# Patient Record
Sex: Male | Born: 1940 | Race: White | Hispanic: No | Marital: Married | Smoking: Current some day smoker
Health system: Southern US, Community
[De-identification: ages and names within clinical notes are randomized; demographics above are authoritative.]

## PROBLEM LIST (undated history)

## (undated) DIAGNOSIS — M4800 Spinal stenosis, site unspecified: Secondary | ICD-10-CM

## (undated) DIAGNOSIS — K5730 Diverticulosis of large intestine without perforation or abscess without bleeding: Secondary | ICD-10-CM

## (undated) DIAGNOSIS — I472 Ventricular tachycardia: Secondary | ICD-10-CM

## (undated) DIAGNOSIS — I82409 Acute embolism and thrombosis of unspecified deep veins of unspecified lower extremity: Secondary | ICD-10-CM

## (undated) DIAGNOSIS — F0280 Dementia in other diseases classified elsewhere without behavioral disturbance: Secondary | ICD-10-CM

## (undated) DIAGNOSIS — N400 Enlarged prostate without lower urinary tract symptoms: Secondary | ICD-10-CM

## (undated) DIAGNOSIS — G3109 Other frontotemporal dementia: Secondary | ICD-10-CM

## (undated) DIAGNOSIS — E785 Hyperlipidemia, unspecified: Secondary | ICD-10-CM

## (undated) DIAGNOSIS — G4730 Sleep apnea, unspecified: Secondary | ICD-10-CM

## (undated) DIAGNOSIS — I10 Essential (primary) hypertension: Secondary | ICD-10-CM

## (undated) HISTORY — DX: Essential (primary) hypertension: I10

## (undated) HISTORY — PX: COLONOSCOPY: SHX174

## (undated) HISTORY — DX: Other frontotemporal dementia: G31.09

## (undated) HISTORY — PX: TONSILLECTOMY: SUR1361

## (undated) HISTORY — PX: HEMORRHOID SURGERY: SHX153

## (undated) HISTORY — DX: Hyperlipidemia, unspecified: E78.5

## (undated) HISTORY — DX: Sleep apnea, unspecified: G47.30

## (undated) HISTORY — DX: Benign prostatic hyperplasia without lower urinary tract symptoms: N40.0

## (undated) HISTORY — PX: VASECTOMY: SHX75

## (undated) HISTORY — DX: Diverticulosis of large intestine without perforation or abscess without bleeding: K57.30

## (undated) HISTORY — PX: ANAL FISTULECTOMY: SHX1139

## (undated) HISTORY — PX: OTHER SURGICAL HISTORY: SHX169

## (undated) HISTORY — DX: Dementia in other diseases classified elsewhere, unspecified severity, without behavioral disturbance, psychotic disturbance, mood disturbance, and anxiety: F02.80

## (undated) HISTORY — DX: Ventricular tachycardia: I47.2

## (undated) HISTORY — DX: Spinal stenosis, site unspecified: M48.00

## (undated) HISTORY — DX: Acute embolism and thrombosis of unspecified deep veins of unspecified lower extremity: I82.409

---

## 1973-10-05 HISTORY — PX: LUMBAR LAMINECTOMY: SHX95

## 2003-03-14 ENCOUNTER — Ambulatory Visit (HOSPITAL_COMMUNITY): Admission: RE | Admit: 2003-03-14 | Discharge: 2003-03-14 | Payer: Self-pay | Admitting: Gastroenterology

## 2004-11-14 ENCOUNTER — Ambulatory Visit: Payer: Self-pay | Admitting: Internal Medicine

## 2005-04-10 ENCOUNTER — Ambulatory Visit: Payer: Self-pay | Admitting: Internal Medicine

## 2005-09-21 ENCOUNTER — Encounter: Admission: RE | Admit: 2005-09-21 | Discharge: 2005-09-21 | Payer: Self-pay | Admitting: Orthopaedic Surgery

## 2006-01-05 ENCOUNTER — Encounter: Admission: RE | Admit: 2006-01-05 | Discharge: 2006-01-05 | Payer: Self-pay | Admitting: Otolaryngology

## 2006-05-26 ENCOUNTER — Ambulatory Visit: Payer: Self-pay | Admitting: Internal Medicine

## 2007-02-26 ENCOUNTER — Encounter: Admission: RE | Admit: 2007-02-26 | Discharge: 2007-02-26 | Payer: Self-pay | Admitting: Neurosurgery

## 2007-04-26 ENCOUNTER — Inpatient Hospital Stay (HOSPITAL_COMMUNITY): Admission: AD | Admit: 2007-04-26 | Discharge: 2007-04-28 | Payer: Self-pay | Admitting: Neurosurgery

## 2007-06-30 ENCOUNTER — Ambulatory Visit: Payer: Self-pay | Admitting: Internal Medicine

## 2007-06-30 DIAGNOSIS — R351 Nocturia: Secondary | ICD-10-CM

## 2007-06-30 DIAGNOSIS — I10 Essential (primary) hypertension: Secondary | ICD-10-CM | POA: Insufficient documentation

## 2007-06-30 DIAGNOSIS — N529 Male erectile dysfunction, unspecified: Secondary | ICD-10-CM | POA: Insufficient documentation

## 2007-06-30 DIAGNOSIS — E785 Hyperlipidemia, unspecified: Secondary | ICD-10-CM | POA: Insufficient documentation

## 2007-06-30 DIAGNOSIS — N400 Enlarged prostate without lower urinary tract symptoms: Secondary | ICD-10-CM | POA: Insufficient documentation

## 2007-06-30 DIAGNOSIS — M4800 Spinal stenosis, site unspecified: Secondary | ICD-10-CM

## 2007-07-04 ENCOUNTER — Encounter (INDEPENDENT_AMBULATORY_CARE_PROVIDER_SITE_OTHER): Payer: Self-pay | Admitting: *Deleted

## 2007-07-08 ENCOUNTER — Ambulatory Visit: Payer: Self-pay | Admitting: Internal Medicine

## 2007-07-11 ENCOUNTER — Encounter (INDEPENDENT_AMBULATORY_CARE_PROVIDER_SITE_OTHER): Payer: Self-pay | Admitting: *Deleted

## 2007-12-07 ENCOUNTER — Telehealth (INDEPENDENT_AMBULATORY_CARE_PROVIDER_SITE_OTHER): Payer: Self-pay | Admitting: *Deleted

## 2008-04-25 ENCOUNTER — Encounter: Payer: Self-pay | Admitting: Internal Medicine

## 2008-05-28 ENCOUNTER — Ambulatory Visit: Payer: Self-pay | Admitting: Internal Medicine

## 2008-07-05 ENCOUNTER — Ambulatory Visit: Payer: Self-pay | Admitting: Internal Medicine

## 2008-07-05 DIAGNOSIS — Z9189 Other specified personal risk factors, not elsewhere classified: Secondary | ICD-10-CM

## 2008-07-05 DIAGNOSIS — K5730 Diverticulosis of large intestine without perforation or abscess without bleeding: Secondary | ICD-10-CM | POA: Insufficient documentation

## 2008-07-16 ENCOUNTER — Encounter (INDEPENDENT_AMBULATORY_CARE_PROVIDER_SITE_OTHER): Payer: Self-pay | Admitting: *Deleted

## 2008-07-18 ENCOUNTER — Ambulatory Visit: Payer: Self-pay | Admitting: Internal Medicine

## 2008-07-18 LAB — CONVERTED CEMR LAB
OCCULT 1: NEGATIVE
OCCULT 2: NEGATIVE

## 2008-07-19 ENCOUNTER — Encounter (INDEPENDENT_AMBULATORY_CARE_PROVIDER_SITE_OTHER): Payer: Self-pay | Admitting: *Deleted

## 2008-08-06 ENCOUNTER — Encounter: Payer: Self-pay | Admitting: Internal Medicine

## 2008-10-08 ENCOUNTER — Encounter: Payer: Self-pay | Admitting: Internal Medicine

## 2008-10-19 ENCOUNTER — Ambulatory Visit: Payer: Self-pay | Admitting: Internal Medicine

## 2008-10-19 DIAGNOSIS — R635 Abnormal weight gain: Secondary | ICD-10-CM | POA: Insufficient documentation

## 2008-10-20 LAB — CONVERTED CEMR LAB
BUN: 18 mg/dL (ref 6–23)
Creatinine, Ser: 1 mg/dL (ref 0.4–1.5)
Potassium: 4.7 meq/L (ref 3.5–5.1)

## 2008-10-22 ENCOUNTER — Encounter (INDEPENDENT_AMBULATORY_CARE_PROVIDER_SITE_OTHER): Payer: Self-pay | Admitting: *Deleted

## 2008-12-27 ENCOUNTER — Encounter: Payer: Self-pay | Admitting: Internal Medicine

## 2009-01-21 ENCOUNTER — Encounter: Payer: Self-pay | Admitting: Internal Medicine

## 2009-01-24 ENCOUNTER — Telehealth (INDEPENDENT_AMBULATORY_CARE_PROVIDER_SITE_OTHER): Payer: Self-pay | Admitting: *Deleted

## 2009-02-13 ENCOUNTER — Encounter: Payer: Self-pay | Admitting: Internal Medicine

## 2009-04-03 ENCOUNTER — Ambulatory Visit: Payer: Self-pay | Admitting: Internal Medicine

## 2009-04-03 DIAGNOSIS — R519 Headache, unspecified: Secondary | ICD-10-CM | POA: Insufficient documentation

## 2009-04-03 DIAGNOSIS — R51 Headache: Secondary | ICD-10-CM

## 2009-04-05 ENCOUNTER — Ambulatory Visit: Payer: Self-pay | Admitting: Internal Medicine

## 2009-04-08 LAB — CONVERTED CEMR LAB: Creatinine, Ser: 1 mg/dL (ref 0.4–1.5)

## 2009-04-09 ENCOUNTER — Encounter (INDEPENDENT_AMBULATORY_CARE_PROVIDER_SITE_OTHER): Payer: Self-pay | Admitting: *Deleted

## 2009-04-10 ENCOUNTER — Ambulatory Visit: Payer: Self-pay | Admitting: Internal Medicine

## 2009-04-11 ENCOUNTER — Ambulatory Visit: Payer: Self-pay | Admitting: Internal Medicine

## 2009-04-12 ENCOUNTER — Encounter (INDEPENDENT_AMBULATORY_CARE_PROVIDER_SITE_OTHER): Payer: Self-pay | Admitting: *Deleted

## 2009-04-15 LAB — CONVERTED CEMR LAB
ALT: 33 units/L (ref 0–53)
Albumin: 4.2 g/dL (ref 3.5–5.2)
Alkaline Phosphatase: 51 units/L (ref 39–117)
Bilirubin, Direct: 0 mg/dL (ref 0.0–0.3)
HDL: 30.7 mg/dL — ABNORMAL LOW (ref >39.00)
LDL Cholesterol: 84 mg/dL (ref 0–99)
Total Bilirubin: 0.9 mg/dL (ref 0.3–1.2)
Total Protein: 7 g/dL (ref 6.0–8.3)

## 2009-05-15 ENCOUNTER — Encounter: Payer: Self-pay | Admitting: Internal Medicine

## 2009-07-31 ENCOUNTER — Telehealth (INDEPENDENT_AMBULATORY_CARE_PROVIDER_SITE_OTHER): Payer: Self-pay | Admitting: *Deleted

## 2009-09-09 ENCOUNTER — Encounter: Payer: Self-pay | Admitting: Internal Medicine

## 2009-10-07 ENCOUNTER — Telehealth (INDEPENDENT_AMBULATORY_CARE_PROVIDER_SITE_OTHER): Payer: Self-pay | Admitting: *Deleted

## 2009-10-07 ENCOUNTER — Ambulatory Visit: Payer: Self-pay | Admitting: Internal Medicine

## 2009-10-07 DIAGNOSIS — R413 Other amnesia: Secondary | ICD-10-CM | POA: Insufficient documentation

## 2009-10-07 DIAGNOSIS — G4730 Sleep apnea, unspecified: Secondary | ICD-10-CM | POA: Insufficient documentation

## 2009-10-14 ENCOUNTER — Encounter (INDEPENDENT_AMBULATORY_CARE_PROVIDER_SITE_OTHER): Payer: Self-pay | Admitting: *Deleted

## 2009-10-14 LAB — CONVERTED CEMR LAB
BUN: 13 mg/dL (ref 6–23)
Creatinine, Ser: 1.1 mg/dL (ref 0.4–1.5)
Potassium: 4.7 meq/L (ref 3.5–5.1)
TSH: 3.87 microintl units/mL (ref 0.35–5.50)
Vitamin B-12: 332 pg/mL (ref 211–911)

## 2009-10-15 ENCOUNTER — Encounter (INDEPENDENT_AMBULATORY_CARE_PROVIDER_SITE_OTHER): Payer: Self-pay | Admitting: *Deleted

## 2009-10-23 ENCOUNTER — Telehealth (INDEPENDENT_AMBULATORY_CARE_PROVIDER_SITE_OTHER): Payer: Self-pay | Admitting: *Deleted

## 2009-11-01 ENCOUNTER — Ambulatory Visit: Payer: Self-pay | Admitting: Internal Medicine

## 2009-12-06 ENCOUNTER — Encounter: Payer: Self-pay | Admitting: Internal Medicine

## 2009-12-15 ENCOUNTER — Encounter: Payer: Self-pay | Admitting: Internal Medicine

## 2009-12-15 ENCOUNTER — Encounter: Admission: RE | Admit: 2009-12-15 | Discharge: 2009-12-15 | Payer: Self-pay | Admitting: Neurology

## 2009-12-18 ENCOUNTER — Encounter: Payer: Self-pay | Admitting: Internal Medicine

## 2010-02-26 ENCOUNTER — Encounter: Payer: Self-pay | Admitting: Internal Medicine

## 2010-03-10 ENCOUNTER — Telehealth: Payer: Self-pay | Admitting: Internal Medicine

## 2010-03-12 ENCOUNTER — Ambulatory Visit: Payer: Self-pay | Admitting: Internal Medicine

## 2010-03-14 ENCOUNTER — Telehealth (INDEPENDENT_AMBULATORY_CARE_PROVIDER_SITE_OTHER): Payer: Self-pay | Admitting: *Deleted

## 2010-03-14 LAB — CONVERTED CEMR LAB
Basophils Relative: 0.4 % (ref 0.0–3.0)
Lymphocytes Relative: 17 % (ref 12.0–46.0)
Lymphs Abs: 2 10*3/uL (ref 0.7–4.0)
MCHC: 34.8 g/dL (ref 30.0–36.0)
Neutrophils Relative %: 70 % (ref 43.0–77.0)
RBC: 4.58 M/uL (ref 4.22–5.81)

## 2010-03-17 ENCOUNTER — Telehealth (INDEPENDENT_AMBULATORY_CARE_PROVIDER_SITE_OTHER): Payer: Self-pay | Admitting: *Deleted

## 2010-03-17 ENCOUNTER — Ambulatory Visit: Payer: Self-pay | Admitting: Internal Medicine

## 2010-03-17 DIAGNOSIS — R5383 Other fatigue: Secondary | ICD-10-CM

## 2010-03-17 DIAGNOSIS — D72829 Elevated white blood cell count, unspecified: Secondary | ICD-10-CM | POA: Insufficient documentation

## 2010-03-17 DIAGNOSIS — R5381 Other malaise: Secondary | ICD-10-CM | POA: Insufficient documentation

## 2010-03-19 LAB — CONVERTED CEMR LAB
ALT: 31 units/L (ref 0–53)
AST: 23 units/L (ref 0–37)
Alkaline Phosphatase: 50 units/L (ref 39–117)
BUN: 16 mg/dL (ref 6–23)
Basophils Absolute: 0.1 10*3/uL (ref 0.0–0.1)
Eosinophils Absolute: 0.3 10*3/uL (ref 0.0–0.7)
Lymphs Abs: 2 10*3/uL (ref 0.7–4.0)
MCV: 94.7 fL (ref 78.0–100.0)
Monocytes Absolute: 1.1 10*3/uL — ABNORMAL HIGH (ref 0.1–1.0)
Monocytes Relative: 9.6 % (ref 3.0–12.0)
Platelets: 276 10*3/uL (ref 150.0–400.0)
RDW: 13.3 % (ref 11.5–14.6)
Sed Rate: 5 mm/hr (ref 0–22)
Total Bilirubin: 0.4 mg/dL (ref 0.3–1.2)
Total Protein: 6.6 g/dL (ref 6.0–8.3)
WBC: 11.5 10*3/uL — ABNORMAL HIGH (ref 4.5–10.5)

## 2010-03-21 ENCOUNTER — Telehealth (INDEPENDENT_AMBULATORY_CARE_PROVIDER_SITE_OTHER): Payer: Self-pay | Admitting: *Deleted

## 2010-03-25 ENCOUNTER — Telehealth (INDEPENDENT_AMBULATORY_CARE_PROVIDER_SITE_OTHER): Payer: Self-pay | Admitting: *Deleted

## 2010-03-28 ENCOUNTER — Encounter: Payer: Self-pay | Admitting: Internal Medicine

## 2010-04-01 LAB — CONVERTED CEMR LAB
Metaneph Total, Ur: 292 ug/24hr (ref 224–832)
Normetanephrine, 24H Ur: 259 (ref 122–676)

## 2010-04-08 ENCOUNTER — Telehealth (INDEPENDENT_AMBULATORY_CARE_PROVIDER_SITE_OTHER): Payer: Self-pay | Admitting: *Deleted

## 2010-04-15 ENCOUNTER — Telehealth (INDEPENDENT_AMBULATORY_CARE_PROVIDER_SITE_OTHER): Payer: Self-pay | Admitting: *Deleted

## 2010-05-13 ENCOUNTER — Telehealth (INDEPENDENT_AMBULATORY_CARE_PROVIDER_SITE_OTHER): Payer: Self-pay | Admitting: *Deleted

## 2010-06-16 ENCOUNTER — Encounter: Payer: Self-pay | Admitting: Internal Medicine

## 2010-07-02 ENCOUNTER — Encounter: Payer: Self-pay | Admitting: Internal Medicine

## 2010-07-04 ENCOUNTER — Telehealth (INDEPENDENT_AMBULATORY_CARE_PROVIDER_SITE_OTHER): Payer: Self-pay | Admitting: *Deleted

## 2010-07-05 HISTORY — PX: OTHER SURGICAL HISTORY: SHX169

## 2010-07-07 ENCOUNTER — Encounter (INDEPENDENT_AMBULATORY_CARE_PROVIDER_SITE_OTHER): Payer: Self-pay | Admitting: *Deleted

## 2010-07-25 ENCOUNTER — Encounter: Payer: Self-pay | Admitting: Internal Medicine

## 2010-08-13 ENCOUNTER — Encounter: Payer: Self-pay | Admitting: Internal Medicine

## 2010-08-21 ENCOUNTER — Encounter: Payer: Self-pay | Admitting: Internal Medicine

## 2010-08-27 ENCOUNTER — Encounter: Payer: Self-pay | Admitting: Internal Medicine

## 2010-08-27 ENCOUNTER — Telehealth: Payer: Self-pay | Admitting: Internal Medicine

## 2010-09-02 ENCOUNTER — Encounter: Admission: RE | Admit: 2010-09-02 | Discharge: 2010-09-02 | Payer: Self-pay | Admitting: Neurosurgery

## 2010-09-10 ENCOUNTER — Telehealth: Payer: Self-pay | Admitting: Internal Medicine

## 2010-10-21 ENCOUNTER — Encounter: Payer: Self-pay | Admitting: Internal Medicine

## 2010-10-24 ENCOUNTER — Ambulatory Visit
Admission: RE | Admit: 2010-10-24 | Discharge: 2010-10-24 | Payer: Self-pay | Source: Home / Self Care | Attending: Internal Medicine | Admitting: Internal Medicine

## 2010-10-24 ENCOUNTER — Other Ambulatory Visit: Payer: Self-pay | Admitting: Internal Medicine

## 2010-10-24 ENCOUNTER — Encounter: Payer: Self-pay | Admitting: Internal Medicine

## 2010-10-24 DIAGNOSIS — I7300 Raynaud's syndrome without gangrene: Secondary | ICD-10-CM | POA: Insufficient documentation

## 2010-10-24 LAB — CBC WITH DIFFERENTIAL/PLATELET
Basophils Absolute: 0.1 10*3/uL (ref 0.0–0.1)
Basophils Relative: 0.6 % (ref 0.0–3.0)
Eosinophils Absolute: 0.4 10*3/uL (ref 0.0–0.7)
Eosinophils Relative: 3.7 % (ref 0.0–5.0)
HCT: 43.2 % (ref 39.0–52.0)
Hemoglobin: 14.8 g/dL (ref 13.0–17.0)
Lymphocytes Relative: 20.6 % (ref 12.0–46.0)
Lymphs Abs: 2.1 10*3/uL (ref 0.7–4.0)
MCHC: 34.3 g/dL (ref 30.0–36.0)
MCV: 95.6 fl (ref 78.0–100.0)
Monocytes Absolute: 0.9 10*3/uL (ref 0.1–1.0)
Monocytes Relative: 9 % (ref 3.0–12.0)
Neutro Abs: 6.7 10*3/uL (ref 1.4–7.7)
Neutrophils Relative %: 66.1 % (ref 43.0–77.0)
Platelets: 268 10*3/uL (ref 150.0–400.0)
RBC: 4.52 Mil/uL (ref 4.22–5.81)
RDW: 13.2 % (ref 11.5–14.6)
WBC: 10.1 10*3/uL (ref 4.5–10.5)

## 2010-10-24 LAB — HEPATIC FUNCTION PANEL
ALT: 36 U/L (ref 0–53)
AST: 29 U/L (ref 0–37)
Albumin: 4.2 g/dL (ref 3.5–5.2)
Alkaline Phosphatase: 53 U/L (ref 39–117)
Bilirubin, Direct: 0.1 mg/dL (ref 0.0–0.3)
Total Bilirubin: 0.6 mg/dL (ref 0.3–1.2)
Total Protein: 6.7 g/dL (ref 6.0–8.3)

## 2010-10-24 LAB — LIPID PANEL
Cholesterol: 143 mg/dL (ref 0–200)
HDL: 32.4 mg/dL — ABNORMAL LOW (ref >39.00)
LDL Cholesterol: 93 mg/dL (ref 0–99)
Total CHOL/HDL Ratio: 4
Triglycerides: 86 mg/dL (ref 0.0–149.0)
VLDL: 17.2 mg/dL (ref 0.0–40.0)

## 2010-10-24 LAB — BASIC METABOLIC PANEL
BUN: 15 mg/dL (ref 6–23)
CO2: 29 mEq/L (ref 19–32)
Calcium: 9.4 mg/dL (ref 8.4–10.5)
Chloride: 106 mEq/L (ref 96–112)
Creatinine, Ser: 1 mg/dL (ref 0.4–1.5)
GFR: 75.01 mL/min (ref >60.00)
Glucose, Bld: 99 mg/dL (ref 70–99)
Potassium: 5.2 mEq/L — ABNORMAL HIGH (ref 3.5–5.1)
Sodium: 142 mEq/L (ref 135–145)

## 2010-10-24 LAB — TSH: TSH: 1.99 u[IU]/mL (ref 0.35–5.50)

## 2010-10-26 DIAGNOSIS — N400 Enlarged prostate without lower urinary tract symptoms: Secondary | ICD-10-CM | POA: Insufficient documentation

## 2010-11-02 LAB — CONVERTED CEMR LAB
AST: 26 units/L (ref 0–37)
Albumin: 4.1 g/dL (ref 3.5–5.2)
Alkaline Phosphatase: 52 units/L (ref 39–117)
BUN: 15 mg/dL (ref 6–23)
Basophils Absolute: 0 10*3/uL (ref 0.0–0.1)
Bilirubin, Direct: 0.1 mg/dL (ref 0.0–0.3)
Cholesterol: 147 mg/dL (ref 0–200)
Creatinine, Ser: 1.1 mg/dL (ref 0.4–1.5)
Eosinophils Relative: 3 % (ref 0.0–5.0)
Glucose, Bld: 102 mg/dL — ABNORMAL HIGH (ref 70–99)
HCT: 44.7 % (ref 39.0–52.0)
HDL goal, serum: 40 mg/dL
HDL goal, serum: 40 mg/dL
LDL Cholesterol: 86 mg/dL (ref 0–99)
LDL Goal: 100 mg/dL
MCHC: 34.8 g/dL (ref 30.0–36.0)
Monocytes Relative: 7.7 % (ref 3.0–11.0)
PSA: 2.26 ng/mL (ref 0.10–4.00)
RBC: 4.84 M/uL (ref 4.22–5.81)
RDW: 12 % (ref 11.5–14.6)
Total Bilirubin: 0.8 mg/dL (ref 0.3–1.2)
Total CHOL/HDL Ratio: 4.9
VLDL: 23 mg/dL (ref 0–40)

## 2010-11-04 NOTE — Assessment & Plan Note (Signed)
Summary: elevated bp//fd   Vital Signs:  Patient profile:   70 year old male Weight:      290 pounds BMI:     38.40 O2 Sat:      95 % Temp:     98.2 degrees F oral Pulse rate:   84 / minute Resp:     16 per minute BP sitting:   150 / 78  (left arm) Cuff size:   large  Vitals Entered By: Shonna Chock (October 07, 2009 2:35 PM) CC: B/P concerns, Hypertension Management Comments REVIEWED MED LIST, PATIENT AGREED DOSE AND INSTRUCTION CORRECT    CC:  B/P concerns and Hypertension Management.  History of Present Illness: BP  checked due to headaches & found to be elevated.BP 170/90-182/110. Headaches are dull, frontal , bilateral, lasting 24/7, w/o radiation. No prodrome or aura. Rx: Tylenol of no benefit. Dr Dohmier diagnosed Sleep Apnea as cause of headaches; snoring better with CPAP but headaches have progressed in last 2 weeks. No PMH of migraines in him or FH.   Hypertension History:      He complains of headache, but denies chest pain, palpitations, dyspnea with exertion, orthopnea, PND, peripheral edema, visual symptoms, neurologic problems, syncope, and side effects from treatment.  He notes no problems with any antihypertensive medication side effects.        Positive major cardiovascular risk factors include male age 32 years old or older, hyperlipidemia, hypertension, and current tobacco user.  Negative major cardiovascular risk factors include no history of diabetes and negative family history for ischemic heart disease.        Further assessment for target organ damage reveals no history of ASHD, stroke/TIA, or peripheral vascular disease.     Allergies: 1)  Crestor 2)  * Cialis  Review of Systems Eyes:  Denies blurring, double vision, and vision loss-both eyes. Neuro:  Complains of memory loss; denies brief paralysis, numbness, tingling, and weakness; Short term memory loss.  Physical Exam  General:  well-nourished,in no acute distress; alert,appropriate and  cooperative throughout examination Eyes:  No corneal or conjunctival inflammation noted. EOMI. Perrla. Funduscopic exam benign, without hemorrhages, exudates or papilledema. FOV grossly normal. Ears:  External ear exam shows no significant lesions or deformities.  Otoscopic examination reveals clear canals, tympanic membranes are intact bilaterally without bulging, retraction, inflammation or discharge. Hearing is grossly normal bilaterally. Mouth:  Oral mucosa and oropharynx without lesions or exudates.  Tongue not deviated Lungs:  Normal respiratory effort, chest expands symmetrically. Lungs are clear to auscultation, no crackles or wheezes. Heart:  Normal rate and regular rhythm. S1 and S2 normal without gallop, murmur, click, rub . S4 Abdomen:  Bowel sounds positive,abdomen soft and non-tender without masses, organomegaly or hernias noted. No AAA or renal artery bruits Pulses:  R and L carotid,radial,dorsalis pedis and posterior tibial pulses are full and equal bilaterally Extremities:  1/2 + left pedal edema and 1/2 + right pedal edema.   Skin:  Intact without suspicious lesions or rashes Psych:  memory intact for recent and remote, normally interactive, and good eye contact.  Knew President, Wedding Anniversary   Impression & Recommendations:  Problem # 1:  HYPERTENSION (ICD-401.9)  His updated medication list for this problem includes:    Ramipril 10 Mg Caps (Ramipril) .Marland Kitchen... Take one capsule every day    Verapamil Hcl 120 Mg Tabs (Verapamil hcl) .Marland Kitchen... 1 two times a day  Orders: Venipuncture (08657) TLB-Creatinine, Blood (82565-CREA) TLB-Potassium (K+) (84132-K) TLB-BUN (Urea Nitrogen) (84520-BUN)  Problem # 2:  HEADACHE (ICD-784.0)  Progressive His updated medication list for this problem includes:    Bayer Aspirin 325 Mg Tabs (Aspirin) .Marland Kitchen... 1 by mouth once daily    Tramadol Hcl 50 Mg Tabs (Tramadol hcl) .Marland Kitchen... 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 Take 1 tab once daily 2)  Ramipril 10 Mg Caps (Ramipril) .... Take one capsule every day 3)  Levitra 20 Mg Tabs (Vardenafil hcl) .Marland Kitchen.. 1 by mouth prn 4)  Flax Seed Oil  5)  Fish Oil  6)  Bayer Aspirin 325 Mg Tabs (Aspirin) .Marland Kitchen.. 1 by mouth once daily 7)  Folic Acid 800 Mcg Tabs (Folic acid) .Marland Kitchen.. 1 by mouth once daily 8)  Voltaren 1 % Gel (Diclofenac sodium) .... Apply two times a day to thumb 9)  Nuvigil 150 Mg Tabs (Armodafinil) .... Takes off/on per dr.dohmeion 10)  Allergy Injections  .Marland KitchenMarland KitchenMarland Kitchen 1 weekly 11)  Tramadol Hcl 50 Mg Tabs (Tramadol hcl) .Marland Kitchen.. 1 q 6 hrs as needed pain 12)  Verapamil Hcl 120 Mg Tabs (Verapamil hcl) .Marland Kitchen.. 1 two times a day  Hypertension Assessment/Plan:      The patient's hypertensive risk group is category B: At least one risk factor (excluding diabetes) with no target organ damage.  His calculated 10 year risk of coronary heart disease is 27 %.  Today's blood pressure is 150/78.    Patient Instructions: 1)  Keep Headachache Diary. Prescriptions: VERAPAMIL HCL 120 MG TABS (VERAPAMIL HCL) 1 two times a day  #60 x 5   Entered and Authorized by:   Marga Melnick MD   Signed by:   Marga Melnick MD on 10/07/2009   Method used:   Faxed to ...       [facility] ([facility]       [address]       [place], [place]  [address]       Ph: [phone] or [phone]       Fax: [phone]   RxID:   [id number] TRAMADOL HCL 50 MG TABS (TRAMADOL HCL) 1 q 6 hrs as needed pain  #30 x 1   Entered and Authorized by:   Marga Melnick MD   Signed by:   Marga Melnick MD on 10/07/2009   Method used:   Faxed to ...       [facility]  (retail)       [address]       [place], [place]  [address]       Ph: [phone] or [phone]       Fax: [phone]   RxID:   [id number]

## 2010-11-04 NOTE — Assessment & Plan Note (Signed)
Summary: f/u, feeling fatigue- jr   Vital Signs:  Patient profile:   70 year old male Weight:      285.6 pounds Temp:     98.2 degrees F oral Pulse rate:   72 / minute Resp:     16 per minute BP sitting:   122 / 80  (left arm) Cuff size:   large  Vitals Entered By: Shonna Chock (November 01, 2009 3:30 PM) CC: Fatigue and ongoing headaches Comments REVIEWED MED LIST, PATIENT AGREED DOSE AND INSTRUCTION CORRECT    CC:  Fatigue and ongoing headaches.  History of Present Illness: Headache & memory problems  persist; Dr Marylou Flesher has not worked up his memory issues. CPAP is being  used 1/2 nightly.When it was used all night there was no effect on headaches. headaches vary from 2-4, greater with HTN. Tramadol  caused constipation. He is due for F/U colonoscopy.  Allergies: 1)  Crestor 2)  * Cialis  Review of Systems General:  Denies chills, fever, and sweats; Weight loss is purposeful. CV:  Denies chest pain or discomfort and palpitations. Resp:  Complains of excessive snoring, hypersomnolence, and morning headaches. Neuro:  Denies brief paralysis, numbness, tingling, and weakness.   Impression & Recommendations:  Problem # 1:  HEADACHE (ICD-784.0)  His updated medication list for this problem includes:    Bayer Aspirin 325 Mg Tabs (Aspirin) .Marland Kitchen... 1 by mouth once daily    Tramadol Hcl 50 Mg Tabs (Tramadol hcl) .Marland Kitchen... 1 q 6 hrs as needed pain  Problem # 2:  HYPERTENSION (ICD-401.9)  His updated medication list for this problem includes:    Ramipril 10 Mg Caps (Ramipril) .Marland Kitchen... Take one capsule every day    Verapamil Hcl 120 Mg Tabs (Verapamil hcl) .Marland Kitchen... 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 Take 1 tab once daily 2)  Ramipril 10 Mg Caps (Ramipril) .... Take one capsule every day 3)  Levitra 20 Mg Tabs (Vardenafil hcl) .Marland Kitchen.. 1 by mouth  prn 4)  Flax Seed Oil  5)  Fish Oil  6)  Bayer Aspirin 325 Mg Tabs (Aspirin) .Marland Kitchen.. 1 by mouth once daily 7)  Folic Acid 800 Mcg Tabs (Folic acid) .Marland Kitchen.. 1 by mouth once daily 8)  Voltaren 1 % Gel (Diclofenac sodium) .... Apply two times a day to thumb 9)  Nuvigil 150 Mg Tabs (Armodafinil) .... Takes off/on per dr.dohmeion 10)  Allergy Injections  .Marland KitchenMarland KitchenMarland Kitchen 1 weekly 11)  Tramadol Hcl 50 Mg Tabs (Tramadol hcl) .Marland Kitchen.. 1 q 6 hrs as needed pain 12)  Verapamil Hcl 120 Mg Tabs (Verapamil hcl) .Marland Kitchen.. 1 two times a day 13)  Gabapentin 100 Mg Caps (Gabapentin) .Marland Kitchen.. 1 q 8 hrs as needed for headache  Patient Instructions: 1)  Check your Blood Pressure regularly. If it is above:135/85 ON AVERAGE  you should make an appointment. Prescriptions: GABAPENTIN 100 MG CAPS (GABAPENTIN) 1 q 8 hrs as needed for headache  #30 x 5   Entered and Authorized by:   Marga Melnick MD   Signed by:   Marga Melnick MD on 11/01/2009   Method used:   Faxed to ...       [facility] ([facility]       [address]       [place], [place]  [address]       Ph:  [phone] or [phone]       Fax: [phone]   RxID:   [id number] SIMVASTATIN 40 MG TABS (SIMVASTATIN) TAKE 1 TAB once daily  #90 Tablet x 0   Entered and Authorized by:   Marga Melnick MD   Signed by:   Marga Melnick MD on 11/01/2009   Method used:   Faxed to ...       [id number] ([facility]ail)       [address]       [place], [place]  [address]       Ph: [phone] or [phone]       Fax: [phone]   RxID:   [id number]

## 2010-11-04 NOTE — Letter (Signed)
Summary: [facility]  [facility]   Imported By: Lanelle Bal 01/07/2010 08:19:09  _____________________________________________________________________  External Attachment:    Type:   Image     Comment:   External Document

## 2010-11-04 NOTE — Progress Notes (Signed)
Summary: refill  Phone Note Refill Request Message from:  Patient  Refills Requested: Medication #1:  VERAPAMIL HCL 120 MG TABS 1 two times a day  Medication #2:  RAMIPRIL 10 MG  CAPS Take one capsule every day  Medication #3:  SIMVASTATIN 40 MG TABS TAKE 1 TAB once daily [facility] - fax [phone]  Initial call taken by: Okey Regal Spring,  May 13, 2010 8:37 AM  Follow-up for Phone Call        RX's faxed to:  [phone]   ** patient needs to schedule appointment for Wellness Visit (CPX-30 min) and have fastinglabs at the same time** Follow-up by: Shonna Chock CMA,  May 13, 2010 11:56 AM    New/Updated Medications: SIMVASTATIN 40 MG TABS (SIMVASTATIN) TAKE 1 TAB once daily **APPOINTMENT DUE** Prescriptions: SIMVASTATIN 40 MG TABS (SIMVASTATIN) TAKE 1 TAB once daily **APPOINTMENT DUE**  #90 x 0   Entered by:   Shonna Chock CMA   Authorized by:   Marga Melnick MD   Signed by:   Shonna Chock CMA on 05/13/2010   Method used:   Print then Give to Patient   RxID:   [id number] VERAPAMIL HCL 120 MG TABS (VERAPAMIL HCL) 1 two times a day  #90 x 0   Entered by:   Shonna Chock CMA   Authorized by:   Marga Melnick MD   Signed by:   Shonna Chock CMA on 05/13/2010   Method used:   Print then Give to Patient   RxID:   [id number] RAMIPRIL 10 MG  CAPS (RAMIPRIL) Take one capsule every day  #90 x 0   Entered by:   Shonna Chock CMA   Authorized by:   Marga Melnick MD   Signed by:   Shonna Chock CMA on 05/13/2010   Method used:   Print then Give to Patient   RxID:   [id number]

## 2010-11-04 NOTE — Progress Notes (Signed)
Summary: lab order  Phone Note Call from Patient   Summary of Call: please ask dr hopper to put the lab order in the computer that was in the email from this patient Initial call taken by: Okey Regal Spring,  March 10, 2010 2:26 PM  Follow-up for Phone Call        Dr.Hopper please advise Follow-up by: Shonna Chock,  March 10, 2010 3:45 PM  Additional Follow-up for Phone Call Additional follow up Details #1::        CBC & dif (285.9) Additional Follow-up by: Marga Melnick MD,  March 11, 2010 5:32 AM

## 2010-11-04 NOTE — Letter (Signed)
Summary: [facility]  [facility]   Imported By: Lanelle Bal 03/08/2010 09:53:30  _____________________________________________________________________  External Attachment:    Type:   Image     Comment:   External Document

## 2010-11-04 NOTE — Letter (Signed)
Summary: [facility]  [facility]   Imported By: Lanelle Bal 12/12/2009 10:46:49  _____________________________________________________________________  External Attachment:    Type:   Image     Comment:   External Document

## 2010-11-04 NOTE — Progress Notes (Signed)
Summary: Where to get machine calibrated  Phone Note Call from Patient Call back at Home Phone [phone] Call back at [phone]   Caller: Patient Summary of Call: Patient was told that he needs to get B/P machine calibrated at last OV, patient went all around town and is unable to find a place to get machine calibrated. Patient would like to know if we know of any places he could go to get this done?   Marland KitchenShonna Chock  October 23, 2009 4:37 PM   Follow-up for Phone Call        I called [facility] to see if this is a service they provide and I was told the only place they know of is [facility] that does this and they gave me their number [phone].   I called patient and shared this information with him./[facility]  October 24, 2009 10:05 AM  Follow-up by: Shonna Chock,  October 24, 2009 10:05 AM

## 2010-11-04 NOTE — Progress Notes (Signed)
Summary: Verapamil refill  Phone Note Refill Request Message from:  Patient on August 27, 2010 10:20 AM  Refills Requested: Medication #1:  VERAPAMIL HCL 120 MG TABS 1 two times a day   [facility] is dropping [facility] order and needs enought to get him until office visit in Jan.  Initial call taken by: Lucious Groves CMA,  August 27, 2010 10:20 AM    Prescriptions: VERAPAMIL HCL 120 MG TABS (VERAPAMIL HCL) 1 two times a day  #60 x 2   Entered by:   Lucious Groves CMA   Authorized by:   Marga Melnick MD   Signed by:   Lucious Groves CMA on 08/27/2010   Method used:   Faxed to ...       [facility] ([facility]       [address]       [place], [place]  [address]       Ph: [phone] or [phone]       Fax: [phone]   RxID:   [id number]

## 2010-11-04 NOTE — Progress Notes (Signed)
  Pt Signed ROI, picked up Copy of CT on CD. [facility]  April 15, 2010 10:22 AM

## 2010-11-04 NOTE — Progress Notes (Signed)
Summary: elevated BP  Phone Note Call from Patient Call back at [phone]   Caller: Spouse Summary of Call: pt c/o severe headache with elevated BP, and fatigue, pt denies headaches,dizziness, chest pain or pressure,SOB, numbness, or tingling. PT BP on sunday 180/102, 170/110, today 2 hour after RAMIPRIL 10 MG 1 tab qd BP was right 182/98,180/100, left170/90, pulse of 100. pt wife is Engineer, civil (consulting).  BP take on large cuff. [facility].......Marland KitchenFelecia Deloach CMA  October 07, 2009 8:52 AM    Follow-up for Phone Call        OV with meds ,double book Gaspar Garbe can see him) Follow-up by: Marga Melnick MD,  October 07, 2009 9:29 AM  Additional Follow-up for Phone Call Additional follow up Details #1::        pt coming in for OV today..............Marland KitchenFelecia Deloach CMA  October 07, 2009 9:43 AM

## 2010-11-04 NOTE — Progress Notes (Signed)
Summary: questions about mobic  Phone Note Call from Patient Call back at [phone]   Caller: Spouse-Meredith Summary of Call: patient wife called and left voicemail says that he was given prescription for mobic and wanted to know if he can still take tylenol   Follow-up for Phone Call        spoke w/ patient informed ok to take tylenol but to alternate between the 2 for pain patient ok'd information.......Marland KitchenDoristine Devoid  March 17, 2010 2:32 PM

## 2010-11-04 NOTE — Progress Notes (Signed)
Summary: verapamil refill   Phone Note Refill Request   Refills Requested: Medication #1:  VERAPAMIL HCL 120 MG TABS 1 two times a day [facility]- fax [phone]  Initial call taken by: Okey Regal Spring,  April 08, 2010 10:09 AM    Prescriptions: VERAPAMIL HCL 120 MG TABS (VERAPAMIL HCL) 1 two times a day  #90 x 1   Entered by:   Doristine Devoid   Authorized by:   Marga Melnick MD   Signed by:   Doristine Devoid on 04/08/2010   Method used:   Faxed to ...       [facility]             , [place]         Ph:        Fax: [phone]   RxID:   [id number]

## 2010-11-04 NOTE — Progress Notes (Signed)
Summary: [facility]  Phone Note Refill Request Call back at [phone] Message from:  Fax from Pharmacy on July 04, 2010 11:45 AM  Refills Requested: Medication #1:  RAMIPRIL 10 MG  CAPS Take one capsule every day  Medication #2:  SIMVASTATIN 40 MG TABS TAKE 1 TAB once daily **APPOINTMENT DUE**  Medication #3:  VERAPAMIL HCL 120 MG TABS 1 two times a day [facility]   FAX [phone]    SEE PHONE NOTE DATED 8/9  Initial call taken by: Jerolyn Shin,  July 04, 2010 11:46 AM  Follow-up for Phone Call        pt left VM  that he was checking on status of refills that were requested last week. Called pt back advise pt that rx request received and will be completed today per chrae............Marland KitchenFelecia Deloach CMA  July 07, 2010 11:14 AM   Additional Follow-up for Phone Call Additional follow up Details #1::        RX's faxed to [phone] Additional Follow-up by: Shonna Chock CMA,  July 07, 2010 11:47 AM    Prescriptions: VERAPAMIL HCL 120 MG TABS (VERAPAMIL HCL) 1 two times a day  #90 x 0   Entered by:   Shonna Chock CMA   Authorized by:   Marga Melnick MD   Signed by:   Shonna Chock CMA on 07/07/2010   Method used:   Print then Give to Patient   RxID:   [id number] RAMIPRIL 10 MG  CAPS (RAMIPRIL) Take one capsule every day  #90 x 0   Entered by:   Shonna Chock CMA   Authorized by:   Marga Melnick MD   Signed by:   Shonna Chock CMA on 07/07/2010   Method used:   Print then Give to Patient   RxID:   [id number] SIMVASTATIN 40 MG TABS (SIMVASTATIN) TAKE 1 TAB once daily **APPOINTMENT DUE**  #90 x 0   Entered by:   Shonna Chock CMA   Authorized by:   Marga Melnick MD   Signed by:   Shonna Chock CMA on 07/07/2010   Method used:   Print then Give to Patient   RxID:   [id number]

## 2010-11-04 NOTE — Progress Notes (Signed)
Summary: Refill  Phone Note Refill Request Message from:  Patient on September 10, 2010 8:33 AM  Refills Requested: Medication #1:  RAMIPRIL 10 MG  CAPS Take one capsule every day Initial call taken by: Lucious Groves CMA,  September 10, 2010 8:33 AM    Prescriptions: RAMIPRIL 10 MG  CAPS (RAMIPRIL) Take one capsule every day  #90 x 0   Entered by:   Lucious Groves CMA   Authorized by:   Marga Melnick MD   Signed by:   Lucious Groves CMA on 09/10/2010   Method used:   Electronically to        [facility]       [address]       [place], [place]  [address]       Ph: [phone] or [phone]       Fax: [phone]   RxID:   [id number]

## 2010-11-04 NOTE — Progress Notes (Signed)
Summary: Lab results  Phone Note Outgoing Call Call back at [facility] Phone [phone] Call back at [phone]   Call placed by: Shonna Chock,  March 21, 2010 4:14 PM Call placed to: Patient Summary of Call: Left message on machine for patient to return call when avaliable, Reason for call:   Lyme & RMSF titers are normal. Please increase Gabapentin to 300 mg every 8 hrs as needed . Please pick up urine collection kit for 24 hr metanephrines & catecholamines; start this "Sunday 06/19 & bring it back Mon . Please consider starting Cymbalta 30 mg once daily  with increase to 60 mg once daily after 5-7 days. This has been of benefit for neuropathic pain . [place]opp  Chrae Malloy  March 21, 2010 4:14 PM   Follow-up for Phone Call        Spoke with patient, all info ok'd. Patient will stop by tomorrow to pick-up 24 hour urine Kit, rx for increased Gabapentin sent to pharmacy. Patient said he will get samples of Cymbalta from his wife Follow-up by: Chrae Malloy,  March 24, 2010 4:13 PM    New/Updated Medications: GABAPENTIN 300 MG CAPS (GABAPENTIN) 1 by mouth every 8 hours as needed Prescriptions: GABAPENTIN 300 MG CAPS (GABAPENTIN) 1 by mouth every 8 hours as needed  #270 x 1   Entered by:   Chrae Malloy   Authorized by:   William Hopper MD   Signed by:   Chrae Malloy on 03/24/2010   Method used:   Electronically to        [facility]       [address]       [place], [place]  [address]       Ph: [phone] or [phone]       Fax: [phone]   RxID:   [id number]   Appended Document: Lab results    Clinical Lists Changes  Orders: Added new Test order of T-Urine 24 Hr. Catecholamines [phone]) - Signed Added new Test order of T-Urine 24 Hr. Metanephrines [phone]) - Signed

## 2010-11-04 NOTE — Letter (Signed)
Summary: Primary Care Consult Scheduled Letter  [facility]  [address] [place], [place] [address]   Phone: [phone]  Fax: [phone]      10/15/2009 MRN: [id number]  [name] [address] [place], [place]  [address]    Dear Mr. Slemmer,    We have scheduled an appointment for you.  At the recommendation of Dr. Marga Melnick, we have scheduled you a consult with Dr. Porfirio Mylar Dohmeier with [facility] on 11-13-2009 at 8:30am.  Their address is [address], [address], [place] [address]. The office phone number is [phone].  If this appointment day and time is not convenient for you, please feel free to call the office of the doctor you are being referred to at the number listed above and reschedule the appointment.    It is important for you to keep your scheduled appointments. We are here to make sure you are given good patient care.   Thank you,    Renee, Patient Care Coordinator [place] [facility]

## 2010-11-04 NOTE — Progress Notes (Signed)
Summary: questions about med  Phone Note Call from Patient Call back at [phone]   Caller: Patient-voicemail  Summary of Call: left msg on voicemail questions about med no further information Initial call taken by: Doristine Devoid,  March 25, 2010 2:10 PM  Follow-up for Phone Call        Patient forgot to mention that he needs a refill on Meloxicam Follow-up by: Shonna Chock,  March 25, 2010 3:00 PM    Prescriptions: MELOXICAM 7.5 MG TABS (MELOXICAM) 1 two times a day as needed for pain  #30 x 0   Entered by:   Shonna Chock   Authorized by:   Marga Melnick MD   Signed by:   Shonna Chock on 03/25/2010   Method used:   Electronically to        [facility]       [address]       [place], [place]  [address]       Ph: [phone] or [phone]       Fax: [phone]   RxID:   [id number]

## 2010-11-04 NOTE — Progress Notes (Signed)
Summary: Lab results  Phone Note Outgoing Call Call back at [facility] Phone [phone]   Call placed by: Shonna Chock,  March 14, 2010 10:11 AM Call placed to: Patient Summary of Call: Left message on VM with lab results below:  No anemia ; white count mildly elevated. Please come in if symptoms or signs of infection are present.  [name]  March 14, 2010 10:11 AM

## 2010-11-04 NOTE — Letter (Signed)
Summary: Primary Care Appointment Letter  [facility]  [address] [place], [place] [address]   Phone: [phone]  Fax: [phone]    07/07/2010 MRN: [id number]  [name] [address] [place], [place]  [address]  Dear [name],   Your Primary Care Physician Marga Melnick MD has indicated that:    ____X___it is time to schedule an appointment (Please call to schedule a physical).    _______you missed your appointment on______ and need to call and          reschedule.    _______you need to have lab work done.    _______you need to schedule an appointment discuss lab or test results.    _______you need to call to reschedule your appointment that is                       scheduled on _________.     Please call our office as soon as possible. Our phone number is [phone]. Please press option 1. Our office is open 8a-5p, Monday through Friday.     Thank you,    [facility]

## 2010-11-04 NOTE — Letter (Signed)
Summary: [facility]  [facility]   Imported By: Lanelle Bal 06/25/2010 11:12:19  _____________________________________________________________________  External Attachment:    Type:   Image     Comment:   External Document

## 2010-11-04 NOTE — Assessment & Plan Note (Signed)
Summary: Headaches/cbs   Vital Signs:  Patient profile:   70 year old male Weight:      293.4 pounds BMI:     38.85 Temp:     98.4 degrees F oral Pulse rate:   72 / minute Resp:     15 per minute BP sitting:   136 / 78  (left arm) Cuff size:   large  Vitals Entered By: Shonna Chock (March 17, 2010 9:29 AM) CC: Headaches Comments REVIEWED MED LIST, PATIENT AGREED DOSE AND INSTRUCTION CORRECT    CC:  Headaches.  History of Present Illness: Serum iron  (34), iron saturation( 11%), & CRP (61.5)  were abnormal in 12/2009 @ Dr Dohmier's office. IBC was normal.CBC & dif here was WNL EXCEPT for elevated WBC.No syptoms or signs of infection other than increasing headaches. Referral has been made to [facility]  Neurology .He presents with Headaches for 2 years @ 2-3 level; now up to 7-8 progressively over several weeks.  The patient reports  frontal sinus pain and sinus pressure, but denies nausea, vomiting, sweats, tearing of eyes, nasal congestion, photophobia, and phonophobia.  High-risk features (red flags) include vision  change and age >50 years.  The patient denies the following high-risk features: fever, neck pain/stiffness, focal weakness, altered mental status, rash, trauma, pain worse with exertion, new type of headache, immunosuppression, concomitant infection, and anticoagulation use.  Prior treatment has included acetaminophen. The typewritten notes provided by Sharyl Nimrod were reviewed  Allergies: 1)  ! Tramadol Hcl (Tramadol Hcl) 2)  Crestor 3)  * Cialis  Review of Systems General:  Complains of sleep disorder; denies chills and weight loss; "6&1/2 hours of good sleep  with CPAP according to Dr Marylou Flesher ". Eyes:  Denies double vision and vision loss-both eyes; Some decreased "focus" OD. ENT:  Denies ear discharge and hoarseness; No facial pain or purulence. He has PMH of otitis ; he has appt 06/29 with Dr Dorma Russell. Resp:  Denies cough and sputum productive. GI:  Denies diarrhea. GU:  Denies  discharge, dysuria, and hematuria. MS:  Denies joint pain, joint redness, and joint swelling; No neck/ shoulder or hip girdle pain. Derm:  Denies lesion(s). Neuro:  Denies brief paralysis, disturbances in coordination, falling down, numbness, tingling, and weakness; Minor postural imbalance. Psych:  He denies depression . Sharyl Nimrod describes poor focus, concentration & attention span . "He just basically is eating, sleeping & watching TV". Allergy:  Complains of itching eyes; denies sneezing; On Allergy shots.  Physical Exam  General:  well-nourished,in no acute distress; alert,appropriate and cooperative throughout examination Head:  No temple tenderness Eyes:  No corneal or conjunctival inflammation noted. EOMI. Perrla.Ptosis bilaterally Ears:  External ear exam shows no significant lesions or deformities.  Otoscopic examination reveals clear canals, tympanic membranes are intact bilaterally without bulging, retraction, inflammation or discharge. Hearing is grossly normal bilaterally. Nose:  External nasal examination shows no deformity or inflammation. Nasal mucosa are pink and moist without lesions or exudates. Mouth:  Oral mucosa and oropharynx without lesions or exudates.  Teeth in good repair. Neck:  No deformities, masses, or tenderness noted. Supple Lungs:  Normal respiratory effort, chest expands symmetrically. Lungs are clear to auscultation, no crackles or wheezes. Heart:  Normal rate and regular rhythm. S1 and S2 normal without gallop, murmur, click, rub. S4 Abdomen:  Bowel sounds positive,abdomen soft and non-tender without masses, organomegaly or hernias noted. Pulses:  R and L carotid,radial,dorsalis pedis and posterior tibial pulses are full and equal bilaterally Extremities:  trace  left pedal edema and trace right pedal edema.  Fusiform finger changes  Neurologic:  alert & oriented X3, strength normal in all extremities, gait normal, and DTRs symmetrical and 1/2 + Skin:  Intact  without suspicious lesions or rashes Cervical Nodes:  No lymphadenopathy noted Axillary Nodes:  No palpable lymphadenopathy Psych:  memory intact for recent and remote, normally interactive, and good eye contact.     Impression & Recommendations:  Problem # 1:  HEADACHE (ICD-784.0)  progressive The following medications were removed from the medication list:    Tramadol Hcl 50 Mg Tabs (Tramadol hcl) .Marland Kitchen... 1 q 6 hrs as needed pain His updated medication list for this problem includes:    Bayer Aspirin 325 Mg Tabs (Aspirin) .Marland Kitchen... 1 by mouth once daily    Tylenol 8 Hour 650 Mg Cr-tabs (Acetaminophen) .Marland Kitchen..Marland Kitchen Two times a day wed/thurs/fri/sat/sun    Ibuprofen 200 Mg Tabs (Ibuprofen) .Marland Kitchen... As needed for headaches    Meloxicam 7.5 Mg Tabs (Meloxicam) .Marland Kitchen... 1 two times a day as needed for pain  Orders: Venipuncture ([address]) TLB-Sedimentation Rate (ESR) (85652-ESR)  Problem # 2:  LEUKOCYTOSIS (ICD-288.60)  w/o localizing signs  Orders: Venipuncture (60454) TLB-CBC Platelet - w/Differential (85025-CBCD)  Problem # 3:  FATIGUE, CHRONIC (ICD-780.79)  Orders: Venipuncture ([address]11) T-Lyme Disease ([address]78-29562) T- * Misc. Laboratory test [phone]) TLB-CBC Platelet - w/Differential (85025-CBCD) TLB-TSH (Thyroid Stimulating Hormone) (84443-TSH) TLB-Hepatic/Liver Function Pnl (80076-HEPATIC) TLB-BMP (Basic Metabolic Panel-BMET) (80048-METABOL)  Complete Medication List: 1)  Simvastatin 40 Mg Tabs (Simvastatin) .... Take 1 tab once daily 2)  Ramipril 10 Mg Caps (Ramipril) .... Take one capsule every day 3)  Levitra 20 Mg Tabs (Vardenafil hcl) .Marland Kitchen.. 1 by mouth prn 4)  Flax Seed Oil 1000 Mg Caps (Flaxseed (linseed)) .Marland Kitchen.. 1 by mouth once daily 5)  Fish Oil 1000 Mg Caps (Omega-3 fatty acids) .Marland Kitchen.. 1 by mouth once daily 6)  Bayer Aspirin 325 Mg Tabs (Aspirin) .Marland Kitchen.. 1 by mouth once daily 7)  Folic Acid 800 Mcg Tabs (Folic acid) .Marland Kitchen.. 1 by mouth once daily 8)  Voltaren 1 % Gel (Diclofenac sodium) ....  Apply two times a day to thumb 9)  Nuvigil 150 Mg Tabs (Armodafinil) .... Takes off/on per dr.dohmeion 10)  Allergy Injections  .Marland KitchenMarland KitchenMarland Kitchen 1 weekly 11)  Verapamil Hcl 120 Mg Tabs (Verapamil hcl) .Marland Kitchen.. 1 two times a day 12)  Gabapentin 100 Mg Caps (Gabapentin) .Marland Kitchen.. 1 q 8 hrs as needed for headache 13)  Ciprodex 0.3-0.1 % Susp (Ciprofloxacin-dexamethasone) .... 4 gtts two times a day , 7 days two times a day 14)  Multivitamins Tabs (Multiple vitamin) .Marland Kitchen.. 1 by mouth once daily 15)  Tylenol 8 Hour 650 Mg Cr-tabs (Acetaminophen) .... Two times a day wed/thurs/fri/sat/sun 16)  Ibuprofen 200 Mg Tabs (Ibuprofen) .... As needed for headaches 17)  Meloxicam 7.5 Mg Tabs (Meloxicam) .Marland Kitchen.. 1 two times a day as needed for pain  Patient Instructions: 1)  Keep Diary of headaches to assess for possible triggers Prescriptions: MELOXICAM 7.5 MG TABS (MELOXICAM) 1 two times a day as needed for pain  #20 x 0   Entered and Authorized by:   Marga Melnick MD   Signed by:   Marga Melnick MD on 03/17/2010   Method used:   Print then Give to Patient   RxID:   [id number]

## 2010-11-04 NOTE — Progress Notes (Signed)
Summary: Med List & Letter with Patient Concerns  Med List & Letter with Patient Concerns   Imported By: Lanelle Bal 03/27/2010 09:59:02  _____________________________________________________________________  External Attachment:    Type:   Image     Comment:   External Document

## 2010-11-04 NOTE — Letter (Signed)
Summary: Results Follow up Letter  [facility]  [address] [place], [place] [address]   Phone: [phone]  Fax: [phone]    10/14/2009 MRN: [id number]  [name] [address] [place], [place]  [address]  Dear [name],  The following are the results of your recent test(s):  Test         Result    Pap Smear:        Normal _____  Not Normal _____ Comments: ______________________________________________________ Cholesterol: LDL(Bad cholesterol):         Your goal is less than:         HDL (Good cholesterol):       Your goal is more than: Comments:  ______________________________________________________ Mammogram:        Normal _____  Not Normal _____ Comments:  ___________________________________________________________________ Hemoccult:        Normal _____  Not normal _______ Comments:    _____________________________________________________________________ Other Tests: PLEASE SEE COPY OF LABS FROM 10/07/09 AND COMMENTS      We routinely do not discuss normal results over the telephone.  If you desire a copy of the results, or you have any questions about this information we can discuss them at your next office visit.   Sincerely,

## 2010-11-06 NOTE — Assessment & Plan Note (Signed)
Summary: CPX/FASTING//KN   Vital Signs:  Patient profile:   70 year old male Height:      74 inches Weight:      296.8 pounds BMI:     38.24 Temp:     98.2 degrees F oral Pulse rate:   64 / minute Resp:     14 per minute BP sitting:   120 / 80  (left arm) Cuff size:   large  Vitals Entered By: Shonna Chock CMA (October 24, 2010 8:34 AM) CC: CPX with fasting labs and refill meds (90 day supply), Lipid Management  Vision Screening:      Vision Comments: Patient had eye exam 10/22/2010 (Wed)   CC:  CPX with fasting labs and refill meds (90 day supply) and Lipid Management.  History of Present Illness: Here for Medicare AWV: 1.Risk factors based on Past M, S, F history:see Diagnoses ; chart updated. [facility] provided by his wife. 2.Physical Activities: no program except walking dog two times a day (2 mpd total) 3.Depression/mood:issues denied( wife disagrees)  4.Hearing:whisper heard @ 6 ft 5.ADL's: no limitations  6.Fall Risk: minor balance issues; fall due to trip on curb 7.Home Safety: no issues  8.Height, weight, &visual acuity:see VS. Dr Jorja Loa Martin's Neuro-Ophth evaluation 10/23/10  reviewed. 9.Counseling: POA & Living Will in place 10.Labs ordered based on risk factors: see Orders 11.Referral Coordination: Colonoscopy due; Podiatry referral 12. Care Plan: see Instructions 13.Cognitive Assessment: see [facility] records.    Hypertension Follow-Up: The patient reports urinary frequency ( Dr McDiarmid has evaluated this), headaches ( see [facility]  & Dr Dohmier's evaluation), and edema with bluish coloration of feet. He denies signifiant Raynaud's or  claudication. He  denies lightheadedness , fatigue, chest pain, chest pressure, dyspnea, palpitations, and syncope.  Compliance with medications (by patient report) has been near 100%.  The patient reports that dietary compliance has been good.  Adjunctive measures currently used by the patient include salt  restriction.  BP documented to be  controlled @ MD appts.    Hyperlipidemia Follow-Up:  The patient denies muscle aches, GI upset, abdominal pain, flushing, itching, constipation, and diarrhea.  Compliance with medications (by patient report) has been near 100%.  Adjunctive measures currently used by the patient include fiber, ASA, folic acid, and fish AND fax seed  oil supplements.    Lipid Management History:      Positive NCEP/ATP III risk factors include male age 12 years old or older, HDL cholesterol less than 40, current tobacco user, and hypertension.  Negative NCEP/ATP III risk factors include non-diabetic, no family history for ischemic heart disease, no ASHD (atherosclerotic heart disease), no prior stroke/TIA, no peripheral vascular disease, and no history of aortic aneurysm.     Preventive Screening-Counseling & Management  Alcohol-Tobacco     Alcohol drinks/day: <1     Smoking Status: current     Cigars/week: 1/ month  Caffeine-Diet-Exercise     Caffeine use/day: 2 cups  Hep-HIV-STD-Contraception     Dental Visit-last 6 months yes     Sun Exposure-Excessive: no  Safety-Violence-Falls     Seat Belt Use: yes     Smoke Detectors: yes      Blood Transfusions:  no.        Travel History:  2005 [place].    Allergies: 1)  ! Tramadol Hcl (Tramadol Hcl) 2)  Crestor 3)  * Cialis  Past History:  Past Medical History: Hyperlipidemia: Framingham Study LDL goal = < 130 if non smoker(  he smokes only 1 cigar/ month); Hypertension; Diverticulosis, colon; Sleep Apnea ; Spinal Stenosis; Benign prostatic hypertrophy  Past Surgical History: Spinal stenosis, S/P  LS surgery  04/2007 Dr Trey Sailors Hemorrhoidectomy & anal fistula repair Lumbar laminectomy 1975 Colonoscopy :tics , Dr Matthias Hughs Tonsillectomy Vasectomy Spinal Tap 09/200:elevated csf protein; + ANA ( normal cryoglobulin,ANCA,Rhematology  evaluation) 10/11; Neuropsych evaluation:?progressive bilat subcortical white  matter disease, R/O autoimmune dementia  Family History: Father: MI @ 94, acohol  abuse, CHF Mother:  DM, HTN, died 89 Siblings: sister: DM  Social History: Retired but teaches 6 hrs / week Married Current Smoker Alcohol use-yes Caffeine use/day:  2 cups Dental Care w/in 6 mos.:  yes Sun Exposure-Excessive:  no Risk analyst Use:  yes Blood Transfusions:  no  Review of Systems  The patient denies anorexia, fever, hoarseness, melena, hematochezia, severe indigestion/heartburn, hematuria, abnormal bleeding, enlarged lymph nodes, and angioedema.         Weight up 40# over 3 years  Physical Exam  General:  well-nourished,in no acute distress; alert,appropriate and cooperative throughout examination Head:  Normocephalic and atraumatic without obvious abnormalities.  Eyes:  No corneal or conjunctival inflammation noted. Perrla. Funduscopic exam benign, without hemorrhages, exudates or papilledema. Ears:  External ear exam shows no significant lesions or deformities.  Otoscopic examination reveals clear canals, tympanic membranes are intact bilaterally without bulging, retraction, inflammation or discharge. Hearing is grossly normal bilaterally. Nose:  External nasal examination shows no deformity or inflammation. Nasal mucosa are pink and moist without lesions or exudates. Mouth:  Oral mucosa and oropharynx without lesions or exudates.  Teeth in good repair. Neck:  No deformities, masses, or tenderness noted. Lungs:  Normal respiratory effort, chest expands symmetrically. Lungs are clear to auscultation, no crackles or wheezes. Heart:  regular rhythm, no murmur, no gallop, no rub, no JVD, no HJR, and bradycardia.   S4 Abdomen:  Bowel sounds positive,abdomen soft and non-tender without masses, organomegaly or hernias noted. Genitalia:  Dr McDiarmid Msk:  No deformity or scoliosis noted of thoracic or lumbar spine.   Pulses:  R and L carotid,radial,dorsalis pedis and posterior tibial pulses  are full and equal bilaterally Extremities:  trace- 1/2  left &  right pedal edema.   Deformed toenails  Neurologic:  DTRs symmetrical and normal.   Skin:  Intact without suspicious lesions or rashes Cervical Nodes:  No lymphadenopathy noted Psych:  normally interactive, good eye contact, not anxious appearing, and not depressed appearing.     Impression & Recommendations:  Problem # 1:  PREVENTIVE HEALTH CARE (ICD-V70.0)  Orders: Medicare -1st Annual Wellness Visit [phone])  Problem # 2:  HYPERTENSION (ICD-401.9)  The following medications were removed from the medication list:    Verapamil Hcl 120 Mg Tabs (Verapamil hcl) .Marland Kitchen... 1 two times a day His updated medication list for this problem includes:    Ramipril 10 Mg Caps (Ramipril) .Marland Kitchen... Take one capsule every day    Hydrochlorothiazide 12.5 Mg Caps (Hydrochlorothiazide) .Marland Kitchen... 1 once daily  Orders: Venipuncture (60454) TLB-BMP (Basic Metabolic Panel-BMET) (80048-METABOL) EKG w/ Interpretation (93000)  Problem # 3:  HYPERLIPIDEMIA NEC/NOS (ICD-272.4)  His updated medication list for this problem includes:    Simvastatin 40 Mg Tabs (Simvastatin) .Marland Kitchen... 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 Take 1 tab once daily 2)  Ramipril 10 Mg Caps (Ramipril) .... Take one  capsule every day 3)  Levitra 20 Mg Tabs (Vardenafil hcl) .Marland Kitchen.. 1 by mouth prn 4)  Flax Seed Oil 1000 Mg Caps (Flaxseed (linseed)) .Marland Kitchen.. 1 by mouth once daily 5)  Fish Oil 1000 Mg Caps (Omega-3 fatty acids) .Marland Kitchen.. 1 by mouth once daily 6)  Bayer Aspirin 325 Mg Tabs (Aspirin) .Marland Kitchen.. 1 by mouth once daily 7)  Folic Acid 800 Mcg Tabs (Folic acid) .Marland Kitchen.. 1 by mouth once daily 8)  Voltaren 1 % Gel (Diclofenac sodium) .... Apply two times a day to thumb 9)  Nuvigil 150 Mg Tabs (Armodafinil) .... Takes off/on per dr.dohmeion 10)  Allergy Injections  .Marland KitchenMarland KitchenMarland Kitchen 1 weekly 11)  Gabapentin 300 Mg Caps (Gabapentin) .Marland Kitchen.. 1 by mouth every 8 hours as needed 12)  Ciprodex 0.3-0.1 % Susp (Ciprofloxacin-dexamethasone) .... 4 gtts two times a day , 7 days two times a day 13)  Multivitamins Tabs (Multiple vitamin) .Marland Kitchen.. 1 by mouth once daily 14)  Tylenol 8 Hour 650 Mg Cr-tabs (Acetaminophen) .... Two times a day wed/thurs/fri/sat/sun 15)  Ibuprofen 200 Mg Tabs (Ibuprofen) .... As needed for headaches 16)  Meloxicam 7.5 Mg Tabs (Meloxicam) .Marland Kitchen.. 1 two times a day as needed for pain, ** appointment due** 17)  Hydrochlorothiazide 12.5 Mg Caps (Hydrochlorothiazide) .Marland Kitchen.. 1 once daily  Lipid Assessment/Plan:      Based on NCEP/ATP III, the patient's risk factor category is "0-1 risk factors".  The patient's lipid goals are as follows: Total cholesterol goal is 200; LDL cholesterol goal is 100; HDL cholesterol goal is 40; Triglyceride goal is 150.  His LDL cholesterol goal has been met.    Patient Instructions: 1)  Limit your Sodium (Salt) to less than 4 grams a day (slightly less than 1 teaspoon) to prevent fluid retention, swelling, or worsening or symptoms. 2)  It is important that you exercise regularly at least 20 minutes 5 times a week. If you develop chest pain, have severe difficulty breathing, or feel very tired , stop exercising immediately and seek medical attention. 3)  Schedule a colonoscopy with Dr Matthias Hughs  to help detect  colon cancer. 4)  Take an  81 mg coated Aspirin every day. 5)  Check your Blood Pressure regularly. If it is above: 135/85 ON AVERAGE you should make an appointment. Prescriptions: GABAPENTIN 300 MG CAPS (GABAPENTIN) 1 by mouth every 8 hours as needed  #270 x 1   Entered and Authorized by:   Marga Melnick MD   Signed by:   Marga Melnick MD on 10/24/2010   Method used:   Print then Give to Patient   RxID:   [id number] LEVITRA 20 MG  TABS (VARDENAFIL HCL) 1 by mouth prn  #9 x 0   Entered and Authorized by:   Marga Melnick MD   Signed by:   Marga Melnick MD on 10/24/2010   Method used:   Print then Give to Patient   RxID:   [id number] RAMIPRIL 10 MG  CAPS (RAMIPRIL) Take one capsule every day  #90 x 3   Entered and Authorized by:   Marga Melnick MD   Signed  by:   Marga Melnick MD on 10/24/2010   Method used:   Print then Give to Patient   RxID:   [id number] HYDROCHLOROTHIAZIDE 12.5 MG CAPS (HYDROCHLOROTHIAZIDE) 1 once daily  #90 x 3   Entered and Authorized by:   Marga Melnick MD   Signed by:   Marga Melnick MD on 10/24/2010   Method used:   Print then Give to Patient   RxID:   [id number] SIMVASTATIN 40 MG TABS (SIMVASTATIN) TAKE 1 TAB once daily  #90 x 3   Entered and Authorized by:   Marga Melnick MD   Signed by:   Marga Melnick MD on 10/24/2010   Method used:   Print then Give to Patient   RxID:   [id number]    Orders Added: 1)  Medicare -1st Annual Wellness Visit [G0438] 2)  Est. Patient Level III [01027] 3)  Venipuncture [36415] 4)  TLB-Lipid Panel [80061-LIPID] 5)  TLB-BMP (Basic Metabolic Panel-BMET) [80048-METABOL] 6)  TLB-CBC Platelet - w/Differential [85025-CBCD] 7)  TLB-Hepatic/Liver Function Pnl [80076-HEPATIC] 8)  TLB-TSH (Thyroid Stimulating Hormone) [84443-TSH] 9)  EKG w/ Interpretation [93000] 10)  Podiatry Referral [Podiatry] 11)  Specimen Handling [99000]

## 2010-11-06 NOTE — Letter (Signed)
Summary: [facility]  [facility]   Imported By: Lanelle Bal 09/17/2010 13:17:42  _____________________________________________________________________  External Attachment:    Type:   Image     Comment:   External Document

## 2010-11-07 NOTE — Letter (Signed)
Summary: E-Mail with Patient Concerns  E-Mail with Patient Concerns   Imported By: Lanelle Bal 10/09/2009 12:24:39  _____________________________________________________________________  External Attachment:    Type:   Image     Comment:   External Document

## 2010-11-12 NOTE — Letter (Signed)
Summary: [facility] Neuropsychology  [facility] Neuropsychology   Imported By: Lanelle Bal 11/04/2010 14:20:19  _____________________________________________________________________  External Attachment:    Type:   Image     Comment:   External Document

## 2010-11-12 NOTE — Progress Notes (Signed)
Summary: Medical Summary Brought by Patient  Medical Summary Brought by Patient   Imported By: Lanelle Bal 11/04/2010 14:17:20  _____________________________________________________________________  External Attachment:    Type:   Image     Comment:   External Document

## 2010-11-12 NOTE — Letter (Signed)
Summary: [facility]  [facility]   Imported By: Lanelle Bal 11/04/2010 14:18:36  _____________________________________________________________________  External Attachment:    Type:   Image     Comment:   External Document

## 2010-11-24 ENCOUNTER — Encounter: Payer: Self-pay | Admitting: Internal Medicine

## 2010-12-02 NOTE — Consult Note (Signed)
Summary: [facility]   Imported By: Maryln Gottron 11/28/2010 13:59:25  _____________________________________________________________________  External Attachment:    Type:   Image     Comment:   External Document

## 2011-02-17 NOTE — H&P (Signed)
NAMEIRVINE, [name] NO.:  [id number]   MEDICAL RECORD NO.:  [id number]          PATIENT TYPE:  INP   LOCATION:  3039                         FACILITY:  [facility]   PHYSICIAN:  Payton Doughty, M.D.      DATE OF BIRTH:  1941/09/12   DATE OF ADMISSION:  04/26/2007  DATE OF DISCHARGE:                              HISTORY & PHYSICAL   ADMISSION DIAGNOSIS:  Lumbar spondylosis with neurogenic claudication.   This is a very nice 70 year old right-handed white gentleman who had a  diskectomy by Dr. Hope Pigeon, L5-S1 in the 1970s.  He has had increasing  pain in his back and pain down his legs particularly after backpacking.  He has worsened over the past year.  MR shows a tight stenosis at 2-3  and 4-5 through 4 is modestly tight.  He is admitted for bilateral  laminotomy, foraminotomy.   MEDICAL HISTORY:  Benign.   MEDICATIONS:  1. Vytorin.  2. Altace.  3. Lodine.   ALLERGIES:  He has no allergies.   SURGICAL HISTORY:  Laminectomy in 1975 and knee operation in 1983.   SOCIAL HISTORY:  Does not smoke or drink.  He is retired.   FAMILY HISTORY:  Mom is 74 with diabetes.  Dad is deceased, history is  not given.   REVIEW OF SYSTEMS:  Remarkable for glasses, ear pain, back pain and leg  pain.   PHYSICAL EXAMINATION:  HEENT:  Within normal limits.  He has reasonable  range of motion of neck.  CHEST:  Clear.  HEART:  There is a 1/6 systolic murmur.  ABDOMEN:  Nontender.  No hepatosplenomegaly.  EXTREMITIES:  Without clubbing or cyanosis.  GU:  Deferred.  Peripheral pulses are good.  NEUROLOGIC:  He is awake, alert and oriented.  Cranial nerves appear to  function normally.  Motor exam shows 5/5 strength throughout the upper  and lower extremities.  Sensory pattern is nondermatomal but  dysesthesias are posturally related.  Reflexes are flicker to right  knee, absent to left;  flicker to right ankle, 1 at the left ankle.   MR demonstrates stenosis as noted above.   CLINICAL IMPRESSION:  Lumbar spondylosis with neurogenic claudication.   PLAN:  The plan is for a bilateral laminotomy, foraminotomy at 2-3, 3-4  and 4-5.  The risks and benefits have been discussed with him and he  wishes to proceed.           ______________________________  Payton Doughty, M.D.     MWR/MEDQ  D:  04/26/2007  T:  04/26/2007  Job:  [id number]

## 2011-02-17 NOTE — Op Note (Signed)
NAMECHRISTOPHERJOHN, SCHIELE NO.:  [id number]   MEDICAL RECORD NO.:  [id number]          PATIENT TYPE:  INP   LOCATION:  2899                         FACILITY:  [facility]   PHYSICIAN:  Payton Doughty, M.D.      DATE OF BIRTH:  10-10-1940   DATE OF PROCEDURE:  04/26/2007  DATE OF DISCHARGE:                               OPERATIVE REPORT   PREOPERATIVE DIAGNOSIS:  Spondylosis L2-3, L3-4, L4-5.   POSTOPERATIVE DIAGNOSIS:  Spondylosis L2-3, L3-4, L4-5.   PROCEDURES:  L2-3, L3-4, L4-5 laminotomy, foraminotomy done bilaterally.   SURGEON:  Payton Doughty, M.D.   ANESTHESIA:  General endotracheal.   PREP:  Sterile Betadine prep and scrub with alcohol wipe.   COMPLICATIONS:  None.   NURSE ASSISTANT:  [place].   DOCTOR ASSISTANT:  Phoebe Perch.   BODY OF TEXT:  This is a 70 year old gentleman with severe spondylosis  and neurogenic claudication, taken to operating room, smoothly  anesthetized, intubated, placed prone on the operating table.  Following  shave, prep, draped in the usual sterile fashion, skin was infiltrated  with 1% lidocaine with 1:400,000 epinephrine, incised from the top of L2  to the bottom of L5.  The lamina of L2, L3, L4 and the top of L5 were  exposed bilaterally in subperiosteal plane.  Intraoperative x-ray  confirmed correctness level.  Having confirmed correctness level,  laminotomy, foraminotomy was performed bilaterally using a high-speed  drill and the Kerrison instruments.  There was abundant redundant  ligamentum flavum in the lateral recess as well as centrally and the  canal overall was small.  Following complete decompression each level,  the neural foramina were carefully explored and found to be open.  Wound  was irrigated.  Hemostasis assured.  Depo-Medrol soaked fat was placed  in laminotomy defects.  Successive layers of 0-0 Vicryl, 2-0 Vicryl, 3-0  nylon were used to close.  Betadine Telfa dressing was applied, made  occlusive with OpSite and  the patient returned recovery room in good  condition.           ______________________________  Payton Doughty, M.D.     MWR/MEDQ  D:  04/26/2007  T:  04/26/2007  Job:  [id number]

## 2011-02-20 NOTE — Op Note (Signed)
   NAME:  [name], [name]                          ACCOUNT NO.:  [id number]   MEDICAL RECORD NO.:  [id number]                   PATIENT TYPE:  AMB   LOCATION:  ENDO                                 FACILITY:  [facility]   PHYSICIAN:  Bernette Redbird, M.D.                DATE OF BIRTH:  [birth year]   DATE OF PROCEDURE:  03/14/2003  DATE OF DISCHARGE:                                 OPERATIVE REPORT   PROCEDURE:  Colonoscopy.   INDICATION:  Screening for colon cancer in an asymptomatic, average risk, 28-  year-old, Caucasian male.   FINDINGS:  Extensive diverticulosis.   DESCRIPTION OF PROCEDURE:  The nature, purpose, and risks of the procedure  had been discussed with the patient who provided written consent.  Sedation  was fentanyl 75 mcg and Versed 8 mg IV without arrhythmias or desaturation.  Digital exam of the prostate was normal.  The Olympus adult video  colonoscope was advanced with mild difficulty through a tortuous, angulated  sigmoid region with extensive diverticulosis and then quite easily the  remainder of the distance around the colon, with the cecum being identified  by visualization of the appendiceal orifice.  Pullback was then performed.  The quality of the prep was very good except for some small stool balls here  and there which are not felt to have obscured any significant lesions.   There was severe diverticulosis in the sigmoid region with scattered  diverticulosis elsewhere in the colon.  This was otherwise a normal  examination, with no evidence of polyps, cancer, colitis, or vascular  malformations.  Retroflexion of the rectum and reinspection of the  rectosigmoid was unremarkable.  No biopsies were obtained.  The patient  tolerated the procedure well, and there were no apparent complications.   IMPRESSION:  Severe diverticulosis, otherwise normal screening colonoscopy.   PLAN:  Consider repeat colonoscopy in five years since it somewhat difficult  to get  optimal visualization, and there was severe diverticular disease and  associated muscular thickening.                                               Bernette Redbird, M.D.    RB/MEDQ  D:  03/14/2003  T:  03/14/2003  Job:  [id number]   cc:   Titus Dubin. Alwyn Ren, M.D. [facility]

## 2011-03-10 ENCOUNTER — Other Ambulatory Visit: Payer: Self-pay | Admitting: Neurosurgery

## 2011-03-10 DIAGNOSIS — M479 Spondylosis, unspecified: Secondary | ICD-10-CM

## 2011-03-17 ENCOUNTER — Ambulatory Visit
Admission: RE | Admit: 2011-03-17 | Discharge: 2011-03-17 | Disposition: A | Discharge status: Home / Self Care | Payer: Medicare Other | Source: Ambulatory Visit | Attending: Neurosurgery | Admitting: Neurosurgery

## 2011-03-17 DIAGNOSIS — M479 Spondylosis, unspecified: Secondary | ICD-10-CM

## 2011-03-17 MED ORDER — GADOBENATE DIMEGLUMINE 529 MG/ML IV SOLN
20.0000 mL | Freq: Once | INTRAVENOUS | Status: AC | PRN
  Administered 2011-03-17: 20 mL via INTRAVENOUS

## 2011-03-25 ENCOUNTER — Other Ambulatory Visit (HOSPITAL_COMMUNITY): Payer: Self-pay | Admitting: Neurosurgery

## 2011-03-25 DIAGNOSIS — M48061 Spinal stenosis, lumbar region without neurogenic claudication: Secondary | ICD-10-CM

## 2011-04-01 ENCOUNTER — Ambulatory Visit (HOSPITAL_COMMUNITY)
Admission: RE | Admit: 2011-04-01 | Discharge: 2011-04-01 | Disposition: A | Discharge status: Home / Self Care | Payer: Medicare Other | Source: Ambulatory Visit | Attending: Neurosurgery | Admitting: Neurosurgery

## 2011-04-01 DIAGNOSIS — M47817 Spondylosis without myelopathy or radiculopathy, lumbosacral region: Secondary | ICD-10-CM | POA: Insufficient documentation

## 2011-04-01 DIAGNOSIS — Z79899 Other long term (current) drug therapy: Secondary | ICD-10-CM | POA: Insufficient documentation

## 2011-04-01 DIAGNOSIS — M48061 Spinal stenosis, lumbar region without neurogenic claudication: Secondary | ICD-10-CM

## 2011-04-01 DIAGNOSIS — Z7982 Long term (current) use of aspirin: Secondary | ICD-10-CM | POA: Insufficient documentation

## 2011-04-01 MED ORDER — IOHEXOL 180 MG/ML  SOLN
20.0000 mL | Freq: Once | INTRAMUSCULAR | Status: AC | PRN
  Administered 2011-04-01: 20 mL via INTRATHECAL

## 2011-04-05 DIAGNOSIS — I472 Ventricular tachycardia, unspecified: Secondary | ICD-10-CM

## 2011-04-05 HISTORY — DX: Ventricular tachycardia, unspecified: I47.20

## 2011-04-05 HISTORY — DX: Ventricular tachycardia: I47.2

## 2011-04-05 HISTORY — PX: LUMBAR LAMINECTOMY: SHX95

## 2011-04-14 ENCOUNTER — Encounter (HOSPITAL_COMMUNITY)
Admission: RE | Admit: 2011-04-14 | Discharge: 2011-04-14 | Disposition: A | Discharge status: Home / Self Care | Payer: Medicare Other | Source: Ambulatory Visit | Attending: Neurosurgery | Admitting: Neurosurgery

## 2011-04-14 ENCOUNTER — Other Ambulatory Visit (HOSPITAL_COMMUNITY): Payer: Self-pay | Admitting: Neurosurgery

## 2011-04-14 ENCOUNTER — Ambulatory Visit (HOSPITAL_COMMUNITY)
Admission: RE | Admit: 2011-04-14 | Discharge: 2011-04-14 | Disposition: A | Discharge status: Home / Self Care | Payer: Medicare Other | Source: Ambulatory Visit | Attending: Neurosurgery | Admitting: Neurosurgery

## 2011-04-14 DIAGNOSIS — Z01810 Encounter for preprocedural cardiovascular examination: Secondary | ICD-10-CM | POA: Insufficient documentation

## 2011-04-14 DIAGNOSIS — Z01818 Encounter for other preprocedural examination: Secondary | ICD-10-CM | POA: Insufficient documentation

## 2011-04-14 DIAGNOSIS — Z01812 Encounter for preprocedural laboratory examination: Secondary | ICD-10-CM | POA: Insufficient documentation

## 2011-04-14 DIAGNOSIS — Z01811 Encounter for preprocedural respiratory examination: Secondary | ICD-10-CM

## 2011-04-14 LAB — CBC
HCT: 43.8 % (ref 39.0–52.0)
Hemoglobin: 15.6 g/dL (ref 13.0–17.0)
MCH: 32.6 pg (ref 26.0–34.0)
MCHC: 35.6 g/dL (ref 30.0–36.0)
MCV: 91.6 fL (ref 78.0–100.0)
Platelets: 253 10*3/uL (ref 150–400)
RBC: 4.78 MIL/uL (ref 4.22–5.81)
RDW: 12.9 % (ref 11.5–15.5)
WBC: 11.7 10*3/uL — ABNORMAL HIGH (ref 4.0–10.5)

## 2011-04-14 LAB — BASIC METABOLIC PANEL
BUN: 15 mg/dL (ref 6–23)
CO2: 29 mEq/L (ref 19–32)
Calcium: 9.8 mg/dL (ref 8.4–10.5)
Chloride: 103 mEq/L (ref 96–112)
Creatinine, Ser: 1.04 mg/dL (ref 0.50–1.35)
GFR calc Af Amer: >60 mL/min (ref >60)
GFR calc non Af Amer: >60 mL/min (ref >60)
Glucose, Bld: 80 mg/dL (ref 70–99)
Potassium: 4.7 mEq/L (ref 3.5–5.1)
Sodium: 141 mEq/L (ref 135–145)

## 2011-04-14 LAB — SURGICAL PCR SCREEN
MRSA, PCR: NEGATIVE
Staphylococcus aureus: POSITIVE — AB

## 2011-04-16 ENCOUNTER — Inpatient Hospital Stay (HOSPITAL_COMMUNITY): Payer: Medicare Other

## 2011-04-16 ENCOUNTER — Inpatient Hospital Stay (HOSPITAL_COMMUNITY)
Admission: RE | Admit: 2011-04-16 | Discharge: 2011-04-24 | DRG: 490 | Disposition: A | Discharge status: Home / Self Care | Payer: Medicare Other | Source: Ambulatory Visit | Attending: Neurosurgery | Admitting: Neurosurgery

## 2011-04-16 DIAGNOSIS — M5137 Other intervertebral disc degeneration, lumbosacral region: Principal | ICD-10-CM | POA: Diagnosis present

## 2011-04-16 DIAGNOSIS — R51 Headache: Secondary | ICD-10-CM | POA: Diagnosis present

## 2011-04-16 DIAGNOSIS — R5082 Postprocedural fever: Secondary | ICD-10-CM | POA: Diagnosis not present

## 2011-04-16 DIAGNOSIS — I10 Essential (primary) hypertension: Secondary | ICD-10-CM | POA: Diagnosis present

## 2011-04-16 DIAGNOSIS — M1120 Other chondrocalcinosis, unspecified site: Secondary | ICD-10-CM | POA: Diagnosis not present

## 2011-04-16 DIAGNOSIS — G4733 Obstructive sleep apnea (adult) (pediatric): Secondary | ICD-10-CM | POA: Diagnosis present

## 2011-04-16 DIAGNOSIS — M25579 Pain in unspecified ankle and joints of unspecified foot: Secondary | ICD-10-CM | POA: Diagnosis not present

## 2011-04-16 DIAGNOSIS — I498 Other specified cardiac arrhythmias: Secondary | ICD-10-CM | POA: Diagnosis not present

## 2011-04-16 DIAGNOSIS — R443 Hallucinations, unspecified: Secondary | ICD-10-CM | POA: Diagnosis not present

## 2011-04-16 DIAGNOSIS — M51379 Other intervertebral disc degeneration, lumbosacral region without mention of lumbar back pain or lower extremity pain: Principal | ICD-10-CM | POA: Diagnosis present

## 2011-04-16 DIAGNOSIS — E785 Hyperlipidemia, unspecified: Secondary | ICD-10-CM | POA: Diagnosis present

## 2011-04-16 DIAGNOSIS — R32 Unspecified urinary incontinence: Secondary | ICD-10-CM | POA: Diagnosis present

## 2011-04-16 DIAGNOSIS — M47817 Spondylosis without myelopathy or radiculopathy, lumbosacral region: Secondary | ICD-10-CM | POA: Diagnosis present

## 2011-04-16 DIAGNOSIS — K5900 Constipation, unspecified: Secondary | ICD-10-CM | POA: Diagnosis present

## 2011-04-16 DIAGNOSIS — D72829 Elevated white blood cell count, unspecified: Secondary | ICD-10-CM | POA: Diagnosis not present

## 2011-04-17 ENCOUNTER — Inpatient Hospital Stay (HOSPITAL_COMMUNITY): Payer: Medicare Other

## 2011-04-17 DIAGNOSIS — R5082 Postprocedural fever: Secondary | ICD-10-CM

## 2011-04-17 LAB — URINALYSIS, ROUTINE W REFLEX MICROSCOPIC
Bilirubin Urine: NEGATIVE
Glucose, UA: NEGATIVE mg/dL
Ketones, ur: NEGATIVE mg/dL
Leukocytes, UA: NEGATIVE
Nitrite: NEGATIVE
Protein, ur: NEGATIVE mg/dL
Specific Gravity, Urine: 1.019 (ref 1.005–1.030)
Urobilinogen, UA: 0.2 mg/dL (ref 0.0–1.0)
pH: 5.5 (ref 5.0–8.0)

## 2011-04-17 LAB — URINE MICROSCOPIC-ADD ON

## 2011-04-17 LAB — DIFFERENTIAL
Basophils Absolute: 0 10*3/uL (ref 0.0–0.1)
Basophils Relative: 0 % (ref 0–1)
Eosinophils Absolute: 0.1 10*3/uL (ref 0.0–0.7)
Eosinophils Relative: 0 % (ref 0–5)
Lymphocytes Relative: 9 % — ABNORMAL LOW (ref 12–46)
Lymphs Abs: 1.4 10*3/uL (ref 0.7–4.0)
Monocytes Absolute: 1.4 10*3/uL — ABNORMAL HIGH (ref 0.1–1.0)
Monocytes Relative: 8 % (ref 3–12)
Neutro Abs: 13.3 10*3/uL — ABNORMAL HIGH (ref 1.7–7.7)
Neutrophils Relative %: 82 % — ABNORMAL HIGH (ref 43–77)

## 2011-04-17 LAB — BASIC METABOLIC PANEL
BUN: 17 mg/dL (ref 6–23)
CO2: 27 mEq/L (ref 19–32)
Calcium: 8.6 mg/dL (ref 8.4–10.5)
Chloride: 101 mEq/L (ref 96–112)
Creatinine, Ser: 1.09 mg/dL (ref 0.50–1.35)
GFR calc Af Amer: >60 mL/min (ref >60)
GFR calc non Af Amer: >60 mL/min (ref >60)
Glucose, Bld: 101 mg/dL — ABNORMAL HIGH (ref 70–99)
Potassium: 4.5 mEq/L (ref 3.5–5.1)
Sodium: 138 mEq/L (ref 135–145)

## 2011-04-17 LAB — CBC
HCT: 38.3 % — ABNORMAL LOW (ref 39.0–52.0)
Hemoglobin: 13.3 g/dL (ref 13.0–17.0)
MCH: 32 pg (ref 26.0–34.0)
MCHC: 34.7 g/dL (ref 30.0–36.0)
MCV: 92.3 fL (ref 78.0–100.0)
Platelets: 215 10*3/uL (ref 150–400)
RBC: 4.15 MIL/uL — ABNORMAL LOW (ref 4.22–5.81)
RDW: 12.9 % (ref 11.5–15.5)
WBC: 16.1 10*3/uL — ABNORMAL HIGH (ref 4.0–10.5)

## 2011-04-18 DIAGNOSIS — R000 Tachycardia, unspecified: Secondary | ICD-10-CM

## 2011-04-18 DIAGNOSIS — R5082 Postprocedural fever: Secondary | ICD-10-CM

## 2011-04-18 LAB — CBC
HCT: 33.8 % — ABNORMAL LOW (ref 39.0–52.0)
Hemoglobin: 11.6 g/dL — ABNORMAL LOW (ref 13.0–17.0)
MCH: 32.1 pg (ref 26.0–34.0)
MCHC: 34.3 g/dL (ref 30.0–36.0)
MCV: 93.6 fL (ref 78.0–100.0)
Platelets: 176 10*3/uL (ref 150–400)
RBC: 3.61 MIL/uL — ABNORMAL LOW (ref 4.22–5.81)
RDW: 13.3 % (ref 11.5–15.5)
WBC: 17.9 10*3/uL — ABNORMAL HIGH (ref 4.0–10.5)

## 2011-04-18 LAB — BASIC METABOLIC PANEL
BUN: 18 mg/dL (ref 6–23)
CO2: 28 mEq/L (ref 19–32)
CO2: 26 mEq/L (ref 19–32)
Calcium: 7.9 mg/dL — ABNORMAL LOW (ref 8.4–10.5)
Chloride: 102 mEq/L (ref 96–112)
Chloride: 103 mEq/L (ref 96–112)
Creatinine, Ser: 1.2 mg/dL (ref 0.50–1.35)
Creatinine, Ser: 1.09 mg/dL (ref 0.50–1.35)
GFR calc Af Amer: >60 mL/min (ref >60)
GFR calc non Af Amer: 60 mL/min — ABNORMAL LOW (ref >60)
Glucose, Bld: 150 mg/dL — ABNORMAL HIGH (ref 70–99)
Glucose, Bld: 166 mg/dL — ABNORMAL HIGH (ref 70–99)
Potassium: 4 mEq/L (ref 3.5–5.1)
Sodium: 135 mEq/L (ref 135–145)

## 2011-04-18 LAB — DIFFERENTIAL
Basophils Absolute: 0 10*3/uL (ref 0.0–0.1)
Basophils Relative: 0 % (ref 0–1)
Eosinophils Absolute: 0.2 10*3/uL (ref 0.0–0.7)
Eosinophils Relative: 1 % (ref 0–5)
Lymphocytes Relative: 11 % — ABNORMAL LOW (ref 12–46)
Lymphs Abs: 2 10*3/uL (ref 0.7–4.0)
Monocytes Absolute: 2.1 10*3/uL — ABNORMAL HIGH (ref 0.1–1.0)
Monocytes Relative: 12 % (ref 3–12)
Neutro Abs: 13.6 10*3/uL — ABNORMAL HIGH (ref 1.7–7.7)
Neutrophils Relative %: 76 % (ref 43–77)

## 2011-04-18 LAB — URINE CULTURE
Colony Count: NO GROWTH
Culture  Setup Time: 201207131232
Culture: NO GROWTH

## 2011-04-18 LAB — CARDIAC PANEL(CRET KIN+CKTOT+MB+TROPI): Relative Index: 1.2 (ref 0.0–2.5)

## 2011-04-18 LAB — MAGNESIUM: Magnesium: 2.2 mg/dL (ref 1.5–2.5)

## 2011-04-19 ENCOUNTER — Inpatient Hospital Stay (HOSPITAL_COMMUNITY): Payer: Medicare Other

## 2011-04-19 DIAGNOSIS — I471 Supraventricular tachycardia: Secondary | ICD-10-CM

## 2011-04-19 DIAGNOSIS — I517 Cardiomegaly: Secondary | ICD-10-CM

## 2011-04-19 LAB — BASIC METABOLIC PANEL
BUN: 10 mg/dL (ref 6–23)
CO2: 30 mEq/L (ref 19–32)
Calcium: 8.4 mg/dL (ref 8.4–10.5)
Chloride: 104 mEq/L (ref 96–112)
Creatinine, Ser: 1.03 mg/dL (ref 0.50–1.35)
GFR calc Af Amer: >60 mL/min (ref >60)
GFR calc non Af Amer: >60 mL/min (ref >60)
Glucose, Bld: 125 mg/dL — ABNORMAL HIGH (ref 70–99)
Potassium: 4.1 mEq/L (ref 3.5–5.1)
Sodium: 139 mEq/L (ref 135–145)

## 2011-04-19 LAB — DIFFERENTIAL
Basophils Absolute: 0 10*3/uL (ref 0.0–0.1)
Basophils Relative: 0 % (ref 0–1)
Eosinophils Absolute: 0.3 10*3/uL (ref 0.0–0.7)
Eosinophils Relative: 2 % (ref 0–5)
Lymphocytes Relative: 13 % (ref 12–46)
Lymphs Abs: 2 10*3/uL (ref 0.7–4.0)
Monocytes Absolute: 1.8 10*3/uL — ABNORMAL HIGH (ref 0.1–1.0)
Monocytes Relative: 12 % (ref 3–12)
Neutro Abs: 10.9 10*3/uL — ABNORMAL HIGH (ref 1.7–7.7)
Neutrophils Relative %: 73 % (ref 43–77)

## 2011-04-19 LAB — CBC
HCT: 35.6 % — ABNORMAL LOW (ref 39.0–52.0)
Hemoglobin: 12.2 g/dL — ABNORMAL LOW (ref 13.0–17.0)
MCH: 31.8 pg (ref 26.0–34.0)
MCHC: 34.3 g/dL (ref 30.0–36.0)
MCV: 92.7 fL (ref 78.0–100.0)
Platelets: 173 10*3/uL (ref 150–400)
RBC: 3.84 MIL/uL — ABNORMAL LOW (ref 4.22–5.81)
RDW: 13.2 % (ref 11.5–15.5)
WBC: 15 10*3/uL — ABNORMAL HIGH (ref 4.0–10.5)

## 2011-04-19 LAB — TSH: TSH: 1.38 u[IU]/mL (ref 0.350–4.500)

## 2011-04-20 ENCOUNTER — Inpatient Hospital Stay (HOSPITAL_COMMUNITY): Payer: Medicare Other

## 2011-04-20 DIAGNOSIS — I4891 Unspecified atrial fibrillation: Secondary | ICD-10-CM

## 2011-04-20 LAB — BASIC METABOLIC PANEL
BUN: 14 mg/dL (ref 6–23)
CO2: 31 mEq/L (ref 19–32)
Calcium: 9.2 mg/dL (ref 8.4–10.5)
Chloride: 99 mEq/L (ref 96–112)
Creatinine, Ser: 1.1 mg/dL (ref 0.50–1.35)
GFR calc Af Amer: >60 mL/min (ref >60)
GFR calc non Af Amer: >60 mL/min (ref >60)
Glucose, Bld: 99 mg/dL (ref 70–99)
Potassium: 4.3 mEq/L (ref 3.5–5.1)
Sodium: 137 mEq/L (ref 135–145)

## 2011-04-20 LAB — DIFFERENTIAL
Basophils Absolute: 0 10*3/uL (ref 0.0–0.1)
Basophils Relative: 0 % (ref 0–1)
Eosinophils Absolute: 0.4 10*3/uL (ref 0.0–0.7)
Eosinophils Relative: 3 % (ref 0–5)
Lymphocytes Relative: 14 % (ref 12–46)
Lymphs Abs: 1.9 10*3/uL (ref 0.7–4.0)
Monocytes Absolute: 1.6 10*3/uL — ABNORMAL HIGH (ref 0.1–1.0)
Monocytes Relative: 12 % (ref 3–12)
Neutro Abs: 9.6 10*3/uL — ABNORMAL HIGH (ref 1.7–7.7)
Neutrophils Relative %: 71 % (ref 43–77)

## 2011-04-20 LAB — CBC
HCT: 36.7 % — ABNORMAL LOW (ref 39.0–52.0)
Hemoglobin: 12.7 g/dL — ABNORMAL LOW (ref 13.0–17.0)
MCH: 32 pg (ref 26.0–34.0)
MCHC: 34.6 g/dL (ref 30.0–36.0)
MCV: 92.4 fL (ref 78.0–100.0)
Platelets: 263 10*3/uL (ref 150–400)
RBC: 3.97 MIL/uL — ABNORMAL LOW (ref 4.22–5.81)
RDW: 12.9 % (ref 11.5–15.5)
WBC: 13.5 10*3/uL — ABNORMAL HIGH (ref 4.0–10.5)

## 2011-04-20 LAB — VANCOMYCIN, TROUGH: Vancomycin Tr: 13.7 ug/mL (ref 10.0–20.0)

## 2011-04-20 NOTE — Op Note (Signed)
NAMEAMISH, [name] NO.:  [id number]  MEDICAL RECORD NO.:  [id number]  LOCATION:  3038                         FACILITY:  [facility]  PHYSICIAN:  Hewitt Shorts, M.D.DATE OF BIRTH:  Feb 15, 1941  DATE OF PROCEDURE:  04/16/2011 DATE OF DISCHARGE:                              OPERATIVE REPORT   PREOPERATIVE DIAGNOSES:  Lumbar stenosis, lumbar spondylosis, lumbar degenerative disease and neurogenic claudication.  POSTOPERATIVE DIAGNOSES:  Lumbar stenosis, lumbar spondylosis, lumbar degenerative disease and neurogenic claudication.  PROCEDURE:  L2-L5 decompressive lumbar laminectomy with decompression of the exiting L2, L3, L4 and L5 nerve roots bilaterally with microdissection microsurgical technique with the operating microscope.  SURGEON:  Hewitt Shorts, MD  ASSISTANT:  Dr. Lovell Sheehan.  ANESTHESIA:  General endotracheal.  INDICATIONS:  The patient is a 70 year old man who presented with neurogenic claudication.  He underwent bilateral L2-3, L3-4, and L4-5 lumbar laminotomies and foraminotomies by Dr. Trey Sailors 4 years ago. However, his symptoms recurred.  He was evaluated with MRI scan, myelogram and postmyelogram CT scan that showed severe spinal stenosis with extensive scarring and a decision made to proceed with definitive decompressive laminectomy.  PROCEDURE:  The patient was brought to the operating room and placed on general endotracheal anesthesia, the patient was turned to prone position, lumbar region was prepped with Betadine soap solution and draped in sterile fashion.  The midline was infiltrated with local anesthetic with epinephrine and the previous midline incision was incised and dissection was carried down through the subcutaneous tissue to the lumbar fascia which was incised bilaterally, as this was done we found 2 of the spinous processes were loose and completely fractured and localized x-ray was taken it was found at those with  the L3 and L4 spinous process.  We then continued the exposure carefully dissecting the paraspinal musculature from the spinous process and lamina.  Self- retaining retractor was placed.  We were able to expose the L2, L3, L4 and L5 spinous process and lamina.  The dissection was very difficult to out the entire procedure because of an extensive scarring from the previous bilateral laminotomies at each level.  Using the operating microscope to provide magnification, illumination and visualization, and using microdissection microsurgical technique we gradually proceeded with a laminectomy and decompression of the thecal sac and exiting nerve roots, to do so we used double-action rongeurs, high-speed drill and Kerrison punches.  At each of the previous laminotomy sites, there was extensive dorsal scarring with fat tissue that Dr. Channing Mutters had placed in the laminotomy defects densely adherent to the thecal sac.  Therefore, the laminectomy was extended laterally and we were able to dissect from the lateral epidural space medially as feasible, removing what dorsal scarring we could, as we did the laminectomy we were able to remove thickened ligamentum flavum that was present bilaterally throughout this spinal canal and in doing so, we were able to decompress spinal canal and thecal sac.  We then turned our attention to the exiting nerve roots as they entered into the neural foramen and removed hypertrophic bone and ligament tissue to decompress the L2, L3, L4, and L5 nerve roots bilaterally.  Dissection was further made more difficult  by the fractures spinous process L3 and L4 and we found that the superior anterior aspect of the fractured L3 spinous process in fact protruded into the dura as it was removed.  There was a defect in the dura and this was closed with two interrupted 6-0 Prolene sutures and then we placed a pledget of dura foam over that.  Good closure of the dura was achieved.  In the  end, other than for the densely adherent fat tissue and scar tissue on the dorsum of the thecal sac at the L2-3, L3-4, and L4-5 levels.  The remainder of the thecal sac and nerve roots were decompressed as felt that scar tissue did not cause significant stenosis at any level and that by having done the laminectomy and having removed the ligamentum flavum.  Then ,we achieved good decompression of the neural structures.  The wound was irrigated with numerous times throughout the procedure with initially with saline solution then with Bacitracin solutio.  Good hemostasis established.  We placed a pledget of Gelfoam with thrombin in the laminectomy defect and then proceeded with closure.  Paraspinal muscles were approximated with interrupted undyed 1 Vicryl sutures. Deep fascia was closed with interrupted undyed 1 Vicryl sutures.  Scarpa fascia was closed with interrupted undyed 1 Vicryl sutures and the subcutaneous and subcuticular were closed with inverted 2 undyed Vicryl sutures.  The skin was approximated with Dermabond and dressed with sterile gauze and Hypafix.  Procedure was tolerated well.  Estimated blood loss was 300 mL.  Sponge and count are correct.  Following surgery, the patient was turned back to supine position to be reversed from the anesthetic, extubated and transferred to the recovery room for further care.     Hewitt Shorts, M.D.     RWN/MEDQ  D:  04/16/2011  T:  04/17/2011  Job:  [id number]  Electronically Signed by Shirlean Kelly M.D. on 04/20/2011 07:37:28 AM

## 2011-04-21 ENCOUNTER — Inpatient Hospital Stay (HOSPITAL_COMMUNITY): Payer: Medicare Other

## 2011-04-21 LAB — CBC
HCT: 34.3 % — ABNORMAL LOW (ref 39.0–52.0)
Hemoglobin: 11.8 g/dL — ABNORMAL LOW (ref 13.0–17.0)
MCH: 31.4 pg (ref 26.0–34.0)
MCHC: 34.4 g/dL (ref 30.0–36.0)
MCV: 91.2 fL (ref 78.0–100.0)
Platelets: 255 10*3/uL (ref 150–400)
RBC: 3.76 MIL/uL — ABNORMAL LOW (ref 4.22–5.81)
RDW: 12.9 % (ref 11.5–15.5)
WBC: 10.9 10*3/uL — ABNORMAL HIGH (ref 4.0–10.5)

## 2011-04-21 LAB — DIFFERENTIAL
Basophils Absolute: 0.1 10*3/uL (ref 0.0–0.1)
Basophils Relative: 1 % (ref 0–1)
Eosinophils Absolute: 0.6 10*3/uL (ref 0.0–0.7)
Eosinophils Relative: 6 % — ABNORMAL HIGH (ref 0–5)
Lymphocytes Relative: 15 % (ref 12–46)
Lymphs Abs: 1.6 10*3/uL (ref 0.7–4.0)
Monocytes Absolute: 1.5 10*3/uL — ABNORMAL HIGH (ref 0.1–1.0)
Monocytes Relative: 14 % — ABNORMAL HIGH (ref 3–12)
Neutro Abs: 7.2 10*3/uL (ref 1.7–7.7)
Neutrophils Relative %: 66 % (ref 43–77)

## 2011-04-21 LAB — BASIC METABOLIC PANEL
BUN: 12 mg/dL (ref 6–23)
CO2: 30 mEq/L (ref 19–32)
Calcium: 8.8 mg/dL (ref 8.4–10.5)
Chloride: 100 mEq/L (ref 96–112)
Creatinine, Ser: 0.97 mg/dL (ref 0.50–1.35)
GFR calc Af Amer: >60 mL/min (ref >60)
GFR calc non Af Amer: >60 mL/min (ref >60)
Glucose, Bld: 123 mg/dL — ABNORMAL HIGH (ref 70–99)
Potassium: 3.4 mEq/L — ABNORMAL LOW (ref 3.5–5.1)
Sodium: 136 mEq/L (ref 135–145)

## 2011-04-22 ENCOUNTER — Inpatient Hospital Stay (HOSPITAL_COMMUNITY): Payer: Medicare Other

## 2011-04-23 ENCOUNTER — Inpatient Hospital Stay (HOSPITAL_COMMUNITY): Payer: Medicare Other

## 2011-04-23 DIAGNOSIS — R5082 Postprocedural fever: Secondary | ICD-10-CM

## 2011-04-23 LAB — BASIC METABOLIC PANEL
BUN: 13 mg/dL (ref 6–23)
CO2: 28 mEq/L (ref 19–32)
Calcium: 9.6 mg/dL (ref 8.4–10.5)
Chloride: 98 mEq/L (ref 96–112)
Creatinine, Ser: 0.96 mg/dL (ref 0.50–1.35)
GFR calc Af Amer: >60 mL/min (ref >60)
GFR calc non Af Amer: >60 mL/min (ref >60)
Glucose, Bld: 133 mg/dL — ABNORMAL HIGH (ref 70–99)
Potassium: 4.2 mEq/L (ref 3.5–5.1)
Sodium: 135 mEq/L (ref 135–145)

## 2011-04-23 LAB — CBC
HCT: 38.4 % — ABNORMAL LOW (ref 39.0–52.0)
Hemoglobin: 13.4 g/dL (ref 13.0–17.0)
MCH: 32.2 pg (ref 26.0–34.0)
MCHC: 34.9 g/dL (ref 30.0–36.0)
MCV: 92.3 fL (ref 78.0–100.0)
Platelets: 326 10*3/uL (ref 150–400)
RBC: 4.16 MIL/uL — ABNORMAL LOW (ref 4.22–5.81)
RDW: 12.9 % (ref 11.5–15.5)
WBC: 15.8 10*3/uL — ABNORMAL HIGH (ref 4.0–10.5)

## 2011-04-23 LAB — CULTURE, BLOOD (ROUTINE X 2)
Culture  Setup Time: 201207132341
Culture  Setup Time: 201207132341
Culture: NO GROWTH
Culture: NO GROWTH

## 2011-04-23 LAB — DIFFERENTIAL
Basophils Absolute: 0.1 10*3/uL (ref 0.0–0.1)
Basophils Relative: 0 % (ref 0–1)
Eosinophils Absolute: 0.6 10*3/uL (ref 0.0–0.7)
Eosinophils Relative: 4 % (ref 0–5)
Lymphocytes Relative: 12 % (ref 12–46)
Lymphs Abs: 1.9 10*3/uL (ref 0.7–4.0)
Monocytes Absolute: 1.5 10*3/uL — ABNORMAL HIGH (ref 0.1–1.0)
Monocytes Relative: 9 % (ref 3–12)
Neutro Abs: 11.7 10*3/uL — ABNORMAL HIGH (ref 1.7–7.7)
Neutrophils Relative %: 74 % (ref 43–77)

## 2011-04-24 LAB — CBC
HCT: 38.8 % — ABNORMAL LOW (ref 39.0–52.0)
Hemoglobin: 13.2 g/dL (ref 13.0–17.0)
MCH: 31.5 pg (ref 26.0–34.0)
MCHC: 34 g/dL (ref 30.0–36.0)
MCV: 92.6 fL (ref 78.0–100.0)
Platelets: 341 10*3/uL (ref 150–400)
RBC: 4.19 MIL/uL — ABNORMAL LOW (ref 4.22–5.81)
RDW: 12.8 % (ref 11.5–15.5)
WBC: 14.6 10*3/uL — ABNORMAL HIGH (ref 4.0–10.5)

## 2011-04-24 LAB — DIFFERENTIAL
Basophils Absolute: 0.1 10*3/uL (ref 0.0–0.1)
Basophils Relative: 0 % (ref 0–1)
Eosinophils Absolute: 0.7 10*3/uL (ref 0.0–0.7)
Eosinophils Relative: 5 % (ref 0–5)
Lymphocytes Relative: 12 % (ref 12–46)
Lymphs Abs: 1.7 10*3/uL (ref 0.7–4.0)
Monocytes Absolute: 1.4 10*3/uL — ABNORMAL HIGH (ref 0.1–1.0)
Monocytes Relative: 10 % (ref 3–12)
Neutro Abs: 10.8 10*3/uL — ABNORMAL HIGH (ref 1.7–7.7)
Neutrophils Relative %: 74 % (ref 43–77)

## 2011-04-27 NOTE — Discharge Summary (Signed)
[name], WEIL NO.:  [id number]  MEDICAL RECORD NO.:  [id number]  LOCATION:  3018                         FACILITY:  [facility]  PHYSICIAN:  Hewitt Shorts, M.D.DATE OF BIRTH:  [birth year]  DATE OF ADMISSION:  04/16/2011 DATE OF DISCHARGE:  04/24/2011                              DISCHARGE SUMMARY   ADMISSION HISTORY AND PHYSICAL EXAMINATION:  The patient is a 70 year old man admitted for treatment of severe multilevel multifactorial lumbar stenosis.  Previously had a surgery by Dr. Trey Sailors, for the lumbar stenosis.  Unfortunately, it was in inadequate decompression and therefore he continued to be symptomatic.  Therefore was readmitted after workup for definitive lumbar decompression. General examination was unremarkable.  Neurologic examination showed intact strength and sensation.  HOSPITAL COURSE:  The patient was admitted, underwent an L2-L5 decompressive lumbar laminectomy.  Postoperatively, he did well initially, on the day following surgery that morning he walked in the halls.  However, later that morning he developed a rising fever and then confusion, his temperature spiked as high as 105.8.  His dressing was removed.  His wound was clean and dry.  There is no erythema, swelling, or drainage.  We proceeded with fever workup including laboratories which showed white blood cell count of 16.1, hemoglobin of 13.3, hematocrit 38.3.  Blood cultures, urinalysis, urine culture and chest x- ray obtained.  Blood culture, final report showed no growth after 5 days.  Urine culture was negative after 2 days.  Chest x-ray showed a streak infiltrate in the right lower lung field, subsequent chest x-rays have shown that is clear.  The patient was resumed on intravenous fluids and transferred to the Intensive Care Unit.  Infectious Disease consultation was obtained.  The patient was started on vancomycin and [place].  Further recommendations by Dr. Cliffton Asters, Infectious Disease consultant.  The patient subsequently defervesced.  He continuing antibiotics for 5 days.  The antibiotics were stopped.  He has not had recurrence of fever but his white count has drifted back up to 15.8 although today it is back down to 14.6 still mildly elevated, but he remained afebrile and without complaints.  In the long run, the source of this high fever spike on the first postoperative day is unknown.  Dr. Orvan Falconer, Infectious Disease consult was not convinced that it is due to infection, as unconvinced that he has had pneumonia or other infection.  The patient did have a brief run of SVT.  He was seen in Cardiology consultation by Dr. Efraim Kaufmann and followed up in consultation by Dr. Charlton Haws, was started on Lopressor 25 mg of b.i.d. and we have given him prescription for Lopressor 25 mg b.i.d. 60 tablets, no refills, but he is to follow up with Dr. Eden Emms within 2 weeks to determine whether he needs to continue on Lopressor or not.  Further prescriptions for the beta-blocker will be per the opinion and discretion of Dr. Eden Emms.  The patient also developed while on antibiotics a swelling and erythema and warmth of the medial aspect of left knee.  He was seen in Orthopedic consultation by Dr. Leonides Grills.  A tap of the knee was attempted  but was unsuccessful.  An MRI was done and Dr. 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  He is recommended Mobic 7.5 mg of b.i.d., a prescription has been given for 60 tablets with no refills, and again he is to follow up with Dr. Lestine Box within 2 weeks to reassess the knee and to continue the Mobic if indicated.  As far as his back is concerned his wound has been healing well and he has been up and ambulating and he is having no significant discomfort in the back.  He has been given a prescription for Norco 5/325 one q.i.d. p.r.n. pain, 50 tablets with no refills as needed.  He is  to follow up with me in about 3 weeks as mentioned above.  He is to follow up with Dr. Eden Emms and Dr. Lestine Box in 2 weeks and he is to follow up with Dr. Orvan Falconer on as-needed basis for significant fever spike for reevaluation from Infectious Disease perspective.  DISCHARGE DIAGNOSIS: 1. Lumbar stenosis - improved. 2. Postop fever, unknown source - resolved. 3. Supraventricular tachycardia - resolved. 4. Left knee pain with chondrocalcinosis, improved.     Hewitt Shorts, M.D.     RWN/MEDQ  D:  04/24/2011  T:  04/24/2011  Job:  [id number]  Electronically Signed by Shirlean Kelly M.D. on 04/27/2011 09:48:01 AM

## 2011-05-21 ENCOUNTER — Encounter: Payer: Self-pay | Admitting: Cardiovascular Disease

## 2011-05-21 NOTE — Consult Note (Signed)
  [name], Bradley NO.:  [id number]  MEDICAL RECORD NO.:  [id number]  LOCATION:  XRAY                         FACILITY:  [facility]  PHYSICIAN:  Richardean Canal, P.A.    DATE OF BIRTH:  1941-07-12  DATE OF CONSULTATION: DATE OF DISCHARGE:  04/14/2011                                CONSULTATION   PREPROCEDURE DIAGNOSIS:  Possible septic knee versus gout.  POSTPROCEDURE DIAGNOSIS:  Left knee pain, unknown origin.  PROCEDURE:  The left knee was prepped with Betadine and then using 2 mL of lidocaine we anesthetized the skin.  Attempt was made to aspirate for lateral approach, dry tap was encountered.  Then, a second aspiration using Betadine to cleanse the skin and then lidocaine 2 mL was used to anesthetize the skin.  It was performed; and through this medial portal, a second aspiration was attempted, again dry tap was encountered.  The patient tolerated this well.  PLAN: 1. At this point in time, we will have the patient undergo AP and     lateral views of the left knee to rule out fracture for SLE,     possible MRI, rule out meniscal tear. 2. ICE, left knee. 3. PT for gentle range of motion in knee.  We will review the patient with Dr. Fonnie Jarvis.     Richardean Canal, P.A.     GC/MEDQ  D:  04/22/2011  T:  04/23/2011  Job:  [id number]  Electronically Signed by Richardean Canal P.A. on 05/04/2011 01:46:48 PM Electronically Signed by Leonides Grills M.D. on 05/21/2011 03:44:29 PM

## 2011-05-21 NOTE — Consult Note (Signed)
  [name], Simpson NO.:  [id number]  MEDICAL RECORD NO.:  [id number]  LOCATION:  3018                         FACILITY:  [facility]  PHYSICIAN:  Leonides Grills, M.D.     DATE OF BIRTH:  December 26, 1940  DATE OF CONSULTATION: DATE OF DISCHARGE:                                CONSULTATION   CHIEF COMPLAINT:  Left knee pain and erythema.  CONSULTING PHYSICIAN:  Hewitt Shorts, MD  HISTORY OF PRESENT ILLNESS:  This is a 70 year old male admitted 7 days ago for an elective L2-L5 decompression lumbar laminectomy.  The patient developed left knee pain on April 21, 2011, that has become worse overnight.  Postop, the patient did develop confusion, fever reported to 105.9 degrees Fahrenheit, questionable right lower lobe pneumonia.  The patient denies history of gout or injury to left knee.  Denies mechanical symptoms of left knee.  PAST MEDICAL HISTORY: 1. Hypertension. 2. Hyperlipidemia. 3. Lumbar stenosis. 4. History of lumbar laminectomy x2. 5. Colonoscopy. 6. Recent lumbar decompression at L2, 3, 4, and 5.  SOCIAL HISTORY:  No tobacco use.  Occasional alcohol use.  ALLERGIES:  No known drug allergies.  MEDICATIONS:  Please see chart.  REVIEW OF SYSTEMS:  Negative for diabetes, cardiac disease, respiratory disease, gout.  Positive for hypertension, hyperlipidemia.  PHYSICAL EXAMINATION:  VITAL SIGNS:  The patient is afebrile.  Vital signs are stable. GENERAL:  The patient is a well-developed and well-nourished male in no acute distress.  The patient is alert and oriented x3. CHEST:  Lungs clear to auscultation bilaterally.  No wheezing, rhonchi, or rales. ABDOMEN:  Soft and nontender.  Positive bowel sounds throughout. CARDIAC:  Regular rate and rhythm.  No murmurs, rubs, or gallops. SKIN:  No rashes.  No skin lesions. NEURO:  Bilateral lower leg sensation intact to light touch. VASCULAR:  Dorsal and pedal pulses present bilaterally.  No calf tenderness.   No edema of lower extremities. LOWER EXTREMITIES:  Full range of motion of the right knee without pain. There is slight pain with the right knee in full extension.  He is unable to fully extend it but with flexion, he has no pain of the knee. Left knee tenderness along the medial joint line.  Positive edema.  No calor.  Equivocal effusion of the left knee.  LABORATORY DATA:  CBC:  White count 10,900, down from 13,500 on April 21, 2011.  Hemoglobin 11.8, hematocrit 34.3, platelets 255,000.  RADIOGRAPHS:  None performed of the left knee.  ASSESSMENT AND PLAN:  Left knee pain with no known injury, questionable gout, septic knee versus internal derangement.  PLAN:  Aspiration, left knee.  Radiographs of left knee to rule out fracture versus OA.  The patient will be reviewed with Dr. Lestine Box including radiographs later today.  Questions were encouraged and answered of the patient and his wife who is present.     Richardean Canal, P.A.   ______________________________ Leonides Grills, M.D.    GC/MEDQ  D:  04/22/2011  T:  04/23/2011  Job:  [id number]  Electronically Signed by Richardean Canal P.A. on 05/04/2011 01:46:54 PM Electronically Signed by Leonides Grills M.D. on 05/21/2011 03:44:31 PM

## 2011-05-22 ENCOUNTER — Encounter: Payer: Self-pay | Admitting: Cardiovascular Disease

## 2011-05-22 ENCOUNTER — Ambulatory Visit (INDEPENDENT_AMBULATORY_CARE_PROVIDER_SITE_OTHER): Payer: Medicare Other | Admitting: Cardiovascular Disease

## 2011-05-22 DIAGNOSIS — I10 Essential (primary) hypertension: Secondary | ICD-10-CM

## 2011-05-22 DIAGNOSIS — E785 Hyperlipidemia, unspecified: Secondary | ICD-10-CM

## 2011-05-22 DIAGNOSIS — I480 Paroxysmal atrial fibrillation: Secondary | ICD-10-CM

## 2011-05-22 DIAGNOSIS — I4891 Unspecified atrial fibrillation: Secondary | ICD-10-CM

## 2011-05-22 MED ORDER — METOPROLOL TARTRATE 25 MG PO TABS: ORAL_TABLET | ORAL | Status: DC

## 2011-05-22 NOTE — Patient Instructions (Signed)
TAKE METOPROLOL 25 MG 1/2 TABLET ONCE DAILY FOR TWO WEEKS AND THEN STOP

## 2011-05-25 DIAGNOSIS — I480 Paroxysmal atrial fibrillation: Secondary | ICD-10-CM | POA: Insufficient documentation

## 2011-05-25 NOTE — Assessment & Plan Note (Signed)
maint NSR  Wean beta blocker.  F/U as needed No need for coumadin

## 2011-05-25 NOTE — Progress Notes (Signed)
69 seen post hospital.  Had lumbar surgery with PAF.  Maint NSR since D/C.  History of elevated lipids and hypertension  No sSCP, palpitations since D/C.  Obese with sleep apnea and some dementia.  Wearing back brace.  Compliant with meds.  Placed on beta blocker in hospital.  Think it is ok to wean and D/C this.  Patient agreeable.    ROS: Denies fever, malais, weight loss, blurry vision, decreased visual acuity, cough, sputum, SOB, hemoptysis, pleuritic pain, palpitaitons, heartburn, abdominal pain, melena, lower extremity edema, claudication, or rash.  All other systems reviewed and negative  General: Affect appropriate Healthy:  appears stated age HEENT: normal Neck supple with no adenopathy JVP normal no bruits no thyromegaly Lungs clear with no wheezing and good diaphragmatic motion Heart:  S1/S2 no murmur,rub, gallop or click PMI normal Abdomen: benighn, BS positve, no tenderness, no AAA no bruit.  No HSM or HJR Distal pulses intact with no bruits No edema Neuro non-focal Skin warm and dry No muscular weakness   Current Outpatient Prescriptions  Medication Sig Dispense Refill  . Armodafinil (NUVIGIL) 150 MG tablet Take 150 mg by mouth daily.        Marland Kitchen aspirin 325 MG tablet Take 325 mg by mouth daily.        . ciprofloxacin-dexamethasone (CIPRODEX) otic suspension 4 drops 2 (two) times daily as needed.       . fish oil-omega-3 fatty acids 1000 MG capsule Take 1 capsule by mouth daily.        . Flaxseed, Linseed, (FLAX SEED OIL) 1000 MG CAPS Take 1 capsule by mouth daily.        Marland Kitchen gabapentin (NEURONTIN) 300 MG capsule Take 300 mg by mouth every 8 (eight) hours as needed.        . hydrochlorothiazide (,MICROZIDE/HYDRODIURIL,) 12.5 MG capsule Take 12.5 mg by mouth daily.        . meloxicam (MOBIC) 7.5 MG tablet Take 7.5 mg by mouth 2 (two) times daily.        . memantine (NAMENDA) 10 MG tablet Take 10 mg by mouth 2 (two) times daily.        . metoprolol tartrate (LOPRESSOR) 25 MG  tablet 1/2 TABLET ONCE DAILY  7 tablet  0  . Multiple Vitamin (MULTIVITAMIN) tablet Take 1 tablet by mouth daily.        . NON FORMULARY ALLERGY INJECTIONS. 1 weekly.       . ramipril (ALTACE) 10 MG capsule Take 10 mg by mouth daily.        . simvastatin (ZOCOR) 40 MG tablet Take 40 mg by mouth at bedtime.        . vardenafil (LEVITRA) 20 MG tablet Take 20 mg by mouth daily as needed.          Allergies  Rosuvastatin; Tadalafil; and Tramadol hcl  Electrocardiogram:  Assessment and Plan

## 2011-05-25 NOTE — Assessment & Plan Note (Signed)
Well controlled.  Continue current medications and low sodium Dash type diet.    

## 2011-05-25 NOTE — Assessment & Plan Note (Signed)
Cholesterol is at goal.  Continue current dose of statin and diet Rx.  No myalgias or side effects.  F/U  LFT's in 6 months. Lab Results  Component Value Date   LDLCALC 93 10/24/2010

## 2011-06-28 NOTE — Consult Note (Signed)
NAMEMONTIE, SWIDERSKI NO.:  [id number]  MEDICAL RECORD NO.:  [id number]  LOCATION:  XRAY                         FACILITY:  [facility]  PHYSICIAN:  Henderson Cloud, MD     DATE OF BIRTH:  [birth year]  DATE OF CONSULTATION: DATE OF DISCHARGE:  04/14/2011                                CONSULTATION   CHIEF COMPLAINT:  Tachycardia.  CONSULTING SERVICE:  [facility].  HISTORY OF PRESENT ILLNESS:  The patient is a 70 year old white male with past medical history significant for hypertension, hyperlipidemia, and lumbar stenosis who was admitted 3 days ago for a L2-L5 decompressive lumbar laminectomy.  We are consulted this evening for a narrow complex tachycardia.  The patient's postoperative course has been complicated by fever up to 105 and possible pneumonia.  Most recent infectious disease note states that they are not entirely clear what causes the fever and that there is no definitive objective evidence for pneumonia.  However, they are treating with antibiotics nonetheless. This evening the patient went into narrow complex tachycardia.  He was asymptomatic from it.  He denies any cardiac history or any history of palpitations.  The patient was in narrow complex tachycardia for approximately 45 minutes and it terminated on its own.  PAST MEDICAL HISTORY:  As above in HPI.  SOCIAL HISTORY:  No tobacco.  Occasional alcohol.  FAMILY HISTORY:  Positive for coronary artery disease.  ALLERGIES:  No known drug allergies.  MEDICATIONS:  The patient is currently on ceftazidime, gabapentin, hydrochlorothiazide, Namenda, Provigil, Altace, Zocor, and vancomycin.  REVIEW OF SYSTEMS:  As in HPI.  All other systems were reviewed and are negative.  PHYSICAL EXAMINATION:  VITAL SIGNS:  Blood pressure 144/82, heart rate is 102, temperature is 97.7. GENERAL:  No acute distress. HEENT:  Normocephalic, atraumatic. NECK:  Supple.  There is no JVD. HEART:  Mildly  tachycardic with occasional ectopy.  No murmur, rub, or gallop. LUNGS:  Clear anteriorly. ABDOMEN:  Soft, nontender. EXTREMITIES:  Warm and dry without edema. PSYCHIATRIC:  The patient is appropriate.  LABORATORY DATA:  Sodium 135, potassium 4.0, chloride 102, CO2 28, BUN 18, creatinine 1.2, glucose 150.  White count 18, hemoglobin 11.6, hematocrit 33.8, platelet count 176,000.  EKG independently interpreted by myself demonstrates what is likely an SVT approximately 160 beats per minute.  Review of telemetry shows sinus rhythm with frequent PACs.  ASSESSMENT:  A 70 year old gentleman postop day 2 for a decompressive lumbar laminectomy with postop course complicated by very high fevers and possible infection now with asymptomatic intermittent supraventricular tachycardia that appears to be hemodynamically stable.  PLAN:  We will start the patient on metoprolol 25 mg b.i.d.  This can quickly be titrated up to at least 50 mg b.i.d. as long his blood pressure remains stable.  We will check a TSH, magnesium, and a transthoracic echocardiogram.  It is likely the patient's SVT is secondary to increased catecholamine response from his fever and possible systemic illness.     Henderson Cloud, MD     SGA/MEDQ  D:  04/18/2011  T:  04/18/2011  Job:  [id number]  Electronically Signed by Raynelle Bring MD on 06/28/2011 09:52:59  AM

## 2011-07-20 LAB — CBC
HCT: 46.1
MCHC: 34.5
MCV: 91.5
Platelets: 324
RDW: 13.1

## 2011-07-20 LAB — COMPREHENSIVE METABOLIC PANEL
AST: 27
Albumin: 3.9
Alkaline Phosphatase: 58
BUN: 17
GFR calc Af Amer: >60
Potassium: 4.3
Sodium: 137
Total Protein: 6.7

## 2011-07-20 LAB — URINALYSIS, ROUTINE W REFLEX MICROSCOPIC
Ketones, ur: NEGATIVE
Nitrite: NEGATIVE
Specific Gravity, Urine: 1.022
Urobilinogen, UA: 1

## 2011-07-20 LAB — DIFFERENTIAL
Basophils Relative: 1
Eosinophils Relative: 3
Monocytes Absolute: 1 — ABNORMAL HIGH
Monocytes Relative: 9
Neutro Abs: 7.8 — ABNORMAL HIGH

## 2011-07-20 LAB — TYPE AND SCREEN: ABO/RH(D): A POS

## 2011-07-20 LAB — APTT: aPTT: 29

## 2011-07-20 LAB — ABO/RH: ABO/RH(D): A POS

## 2011-10-28 ENCOUNTER — Encounter: Payer: Self-pay | Admitting: Internal Medicine

## 2011-10-28 ENCOUNTER — Ambulatory Visit (INDEPENDENT_AMBULATORY_CARE_PROVIDER_SITE_OTHER): Payer: Medicare Other | Admitting: Internal Medicine

## 2011-10-28 DIAGNOSIS — E785 Hyperlipidemia, unspecified: Secondary | ICD-10-CM

## 2011-10-28 DIAGNOSIS — R413 Other amnesia: Secondary | ICD-10-CM

## 2011-10-28 DIAGNOSIS — K5730 Diverticulosis of large intestine without perforation or abscess without bleeding: Secondary | ICD-10-CM

## 2011-10-28 DIAGNOSIS — R7309 Other abnormal glucose: Secondary | ICD-10-CM

## 2011-10-28 DIAGNOSIS — Z0000 Encounter for general adult medical examination without abnormal findings: Secondary | ICD-10-CM

## 2011-10-28 DIAGNOSIS — T887XXA Unspecified adverse effect of drug or medicament, initial encounter: Secondary | ICD-10-CM

## 2011-10-28 DIAGNOSIS — I10 Essential (primary) hypertension: Secondary | ICD-10-CM

## 2011-10-28 LAB — BASIC METABOLIC PANEL
BUN: 16 mg/dL (ref 6–23)
Creatinine, Ser: 1 mg/dL (ref 0.4–1.5)
GFR: 79.17 mL/min (ref >60.00)

## 2011-10-28 LAB — CBC WITH DIFFERENTIAL/PLATELET
Eosinophils Relative: 3.5 % (ref 0.0–5.0)
Monocytes Relative: 8.9 % (ref 3.0–12.0)
Neutrophils Relative %: 66.8 % (ref 43.0–77.0)
Platelets: 243 10*3/uL (ref 150.0–400.0)
WBC: 9.8 10*3/uL (ref 4.5–10.5)

## 2011-10-28 LAB — LIPID PANEL
LDL Cholesterol: 85 mg/dL (ref 0–99)
Total CHOL/HDL Ratio: 4
Triglycerides: 125 mg/dL (ref 0.0–149.0)

## 2011-10-28 LAB — HEPATIC FUNCTION PANEL
ALT: 50 U/L (ref 0–53)
AST: 34 U/L (ref 0–37)
Bilirubin, Direct: 0.1 mg/dL (ref 0.0–0.3)
Total Bilirubin: 0.8 mg/dL (ref 0.3–1.2)

## 2011-10-28 MED ORDER — SIMVASTATIN 40 MG PO TABS: 40.0000 mg | ORAL_TABLET | Freq: Every day | ORAL | Status: DC

## 2011-10-28 MED ORDER — RAMIPRIL 10 MG PO CAPS: 10.0000 mg | ORAL_CAPSULE | Freq: Every day | ORAL | Status: DC

## 2011-10-28 MED ORDER — HYDROCHLOROTHIAZIDE 12.5 MG PO CAPS: 12.5000 mg | ORAL_CAPSULE | Freq: Every day | ORAL | Status: DC

## 2011-10-28 NOTE — Patient Instructions (Signed)
Preventive Health Care: Exercise at least 30-45 minutes a day,  3-4 days a week.  Eat a low-fat diet with lots of fruits and vegetables, up to 7-9 servings per day.Consume less than 40 grams of sugar per day from foods & drinks with High Fructose Corn Sugar as # 1,2,3 or # 4 on label. Share results with all MDs seen

## 2011-10-28 NOTE — Progress Notes (Signed)
Subjective:    Patient ID: [name], male    DOB: [birth year], 71 y.o.   MRN: [id number]  HPI Medicare Wellness Visit:  The following psychosocial & medical history were reviewed as required by Medicare.   Social history: caffeine: 2 cups of coffee & 1 tea , alcohol:  1 wine / day ,  tobacco use : 1 cigar / month  & exercise : walking 30 min daily.   Home & personal  safety / fall risk: no issues  , activities of daily living: no limitations , seatbelt use : yes , and smoke alarm employment : yes .  Power of Attorney/Living Will status : in place  Vision ( as recorded per Nurse) & Hearing  evaluation :  Ophth exam due;whisper heard @ 6 ft. Orientation :oriented X 3 , memory & recall : 2 of 3 recalled ,  math testing: good,and mood & affect :normal . Depression / anxiety: chronically" feels bad" Travel history : 2007 [place] , immunization status :Shingles needed , transfusion history:  no, and preventive health surveillance ( colonoscopies, BMD , etc as per protocol/ [facility]): colonoscopy due as per Dr Matthias Hughs, Dental care:  Seen every 6 months . Chart reviewed &  Updated. Active issues reviewed & addressed.       Review of Systems  He started having radicular pain in  LLE after standing for a period in 10/12. This is the reason he had the surgery in July 2012 by Dr. Jule Ser. He denies numbness or tingling in the leg; he also denies incontinence of urine or stool.  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 He is on Namenda. Provigil is mainly for the sleep apnea.    Objective:   Physical Exam Gen.: Healthy and well-nourished in appearance. Alert, appropriate and cooperative throughout exam. See abbreviated mental status testing Head: Normocephalic without obvious abnormalities Eyes: No corneal or conjunctival inflammation noted. Pupils equal round reactive to light and accommodation. Fundal exam is benign without hemorrhages, exudate, papilledema. Extraocular motion  intact. Vision grossly normal with lenses. Significant ptosis bilaterally Ears: External  ear exam reveals no significant lesions or deformities. Canals clear .TMs normal. Hearing is grossly normal bilaterally. Nose: External nasal exam reveals no deformity or inflammation. Nasal mucosa are pink and moist. No lesions or exudates noted.   Mouth: Oral mucosa and oropharynx reveal no lesions or exudates. Teeth in good repair. Neck: No deformities, masses, or tenderness noted. Range of motion & Thyroid normal Lungs: Normal respiratory effort; chest expands symmetrically. Lungs are clear to auscultation without rales, wheezes, or increased work of breathing. Heart: Normal rate and rhythm. Normal S1 and S2. No gallop, click, or rub. Grade 1/2-1 over 6 systolic murmur  Abdomen: Bowel sounds normal; abdomen soft and nontender. No masses, organomegaly or hernias noted. Genitalia/ DRE: Varices in the left scrotum. Prostate is normal without induration, nodularity, enlargement or asymmetry.                                                                                  Musculoskeletal/extremities: No deformity or scoliosis noted of  the thoracic or lumbar spine. No clubbing, cyanosis or deformity  noted. Range of motion  normal .Tone & strength  normal.Joints normal. Nail health :thickened toenails.Trace ankle edema Vascular: Carotid, radial artery, dorsalis pedis and  posterior tibial pulses are full and equal. No bruits present. Neurologic:  Deep tendon reflexes symmetrical but 0-1/2+ @ knees         Skin: Intact without suspicious lesions or rashes. Plethora of toes. Lymph: No cervical, axillary, or inguinal lymphadenopathy present. Psych: Mood and affect are normal. Normally interactive                                                                                         Assessment & Plan:  #1 Medicare Wellness Exam; criteria met ; data entered #2 Problem List reviewed ; Assessment/  Recommendations made Plan: see Orders

## 2011-11-11 ENCOUNTER — Other Ambulatory Visit: Payer: Self-pay | Admitting: Neurosurgery

## 2011-11-11 DIAGNOSIS — IMO0002 Reserved for concepts with insufficient information to code with codable children: Secondary | ICD-10-CM

## 2011-11-11 DIAGNOSIS — M47817 Spondylosis without myelopathy or radiculopathy, lumbosacral region: Secondary | ICD-10-CM

## 2011-11-11 DIAGNOSIS — M5137 Other intervertebral disc degeneration, lumbosacral region: Secondary | ICD-10-CM

## 2011-11-15 ENCOUNTER — Ambulatory Visit
Admission: RE | Admit: 2011-11-15 | Discharge: 2011-11-15 | Disposition: A | Discharge status: Home / Self Care | Payer: Medicare Other | Source: Ambulatory Visit | Attending: Neurosurgery | Admitting: Neurosurgery

## 2011-11-15 DIAGNOSIS — M47817 Spondylosis without myelopathy or radiculopathy, lumbosacral region: Secondary | ICD-10-CM

## 2011-11-15 DIAGNOSIS — M5137 Other intervertebral disc degeneration, lumbosacral region: Secondary | ICD-10-CM

## 2011-11-15 DIAGNOSIS — IMO0002 Reserved for concepts with insufficient information to code with codable children: Secondary | ICD-10-CM

## 2011-11-15 MED ORDER — GADOBENATE DIMEGLUMINE 529 MG/ML IV SOLN
20.0000 mL | Freq: Once | INTRAVENOUS | Status: AC | PRN
  Administered 2011-11-15: 20 mL via INTRAVENOUS

## 2012-01-11 ENCOUNTER — Ambulatory Visit (INDEPENDENT_AMBULATORY_CARE_PROVIDER_SITE_OTHER): Payer: Medicare Other | Admitting: Internal Medicine

## 2012-01-11 ENCOUNTER — Encounter: Payer: Self-pay | Admitting: Internal Medicine

## 2012-01-11 VITALS — BP 136/90 | HR 90 | Temp 98.3°F | Wt 300.0 lb

## 2012-01-11 DIAGNOSIS — R040 Epistaxis: Secondary | ICD-10-CM

## 2012-01-11 DIAGNOSIS — IMO0002 Reserved for concepts with insufficient information to code with codable children: Secondary | ICD-10-CM

## 2012-01-11 DIAGNOSIS — M5410 Radiculopathy, site unspecified: Secondary | ICD-10-CM

## 2012-01-11 DIAGNOSIS — I10 Essential (primary) hypertension: Secondary | ICD-10-CM

## 2012-01-11 LAB — CBC WITH DIFFERENTIAL/PLATELET
Basophils Absolute: 0 10*3/uL (ref 0.0–0.1)
Eosinophils Absolute: 0.2 10*3/uL (ref 0.0–0.7)
Eosinophils Relative: 1.3 % (ref 0.0–5.0)
MCV: 96.6 fl (ref 78.0–100.0)
Monocytes Absolute: 1.1 10*3/uL — ABNORMAL HIGH (ref 0.1–1.0)
Neutrophils Relative %: 75.4 % (ref 43.0–77.0)
Platelets: 257 10*3/uL (ref 150.0–400.0)
RDW: 13 % (ref 11.5–14.6)
WBC: 12.3 10*3/uL — ABNORMAL HIGH (ref 4.5–10.5)

## 2012-01-11 LAB — BASIC METABOLIC PANEL
GFR: 73.93 mL/min (ref >60.00)
Glucose, Bld: 105 mg/dL — ABNORMAL HIGH (ref 70–99)
Potassium: 4.3 mEq/L (ref 3.5–5.1)
Sodium: 139 mEq/L (ref 135–145)

## 2012-01-11 NOTE — Patient Instructions (Signed)
Use Eucerin   twice a day  for the  Nasal drying. Blood Pressure Goal  Ideally is an AVERAGE < 135/85. This AVERAGE should be calculated from @ least 5-7 BP readings taken @ different times of day on different days of week. You should not respond to isolated BP readings , but rather the AVERAGE for that week. Metoprolol 25 mg one half twice a day will be started if blood pressure is not well controlled. Stop licorice intake. Meloxicam may have long-term cardiovascular and GI risk. Please discuss other medications with Dr Newell Coral. Perhaps a low dose fentanyl patch may be an option for the pain.  Please try to go on My Chart within the next 24 hours to allow me to release the results directly to you.

## 2012-01-11 NOTE — Assessment & Plan Note (Signed)
He should remain off Licorice; if blood pressure is not adequately controlled metoprolol 25 mg one half twice a day would be initiated.

## 2012-01-11 NOTE — Progress Notes (Signed)
Subjective:    Patient ID: [name], male    DOB: June 24, 1941, 71 y.o.   MRN: [id number]  HPI 01/04/12 he experienced brisk epistaxis after blowing his nose. This was treated with pressure , ice & Afrin with resolution. It recurred 4/2 but again was controlled without having to go to the emergency room.  He noted his blood pressure to be as high as 210/100.  He had been ingesting up to a box of licorice daily for several months.  He denies hemoptysis, hematuria, rectal bleeding, or melena. He has known abnormal bruising or bleeding. He denies excess intake of aspirin or nonsteroidal agents, but did stop Mobic & ASA on 4/2.    Review of Systems  Disease Monitoring  Blood pressure range: 210/100 on 4/2 ; steady decrease since (see  meds below)              Chest pain:no   Dyspnea: no   Claudication: no Medication compliance:  He is on Microzide 12.5 mg and Benadryl 10 mg daily. He does not recognize the listed  metoprolol 25 mg one half daily as an active medicine. Medication Side Effects  Lightheadedness:no   Urinary frequency: chronic , not new   Edema: no   Preventitive Healthcare:  Exercise:walking 20 min twice a day  Diet Pattern: decreased calories, low fat  Salt Restriction: yes       Objective:   Physical Exam Gen.: Healthy and well-nourished in appearance. Alert, appropriate and cooperative throughout exam.   Eyes: No corneal or conjunctival inflammation noted. Ears: External  ear exam reveals no significant lesions or deformities. Canals clear .TMs normal. Hearing is grossly normal bilaterally. Nose: External nasal exam reveals no deformity or inflammation. Nasal mucosa are very dry; crusting on L of septum.  Mouth: Oral mucosa and oropharynx reveal no lesions or exudates. Teeth in good repair. Lungs: Normal respiratory effort; chest expands symmetrically. Lungs are clear to auscultation without rales, wheezes, or increased work of breathing. Heart: Normal rate  and rhythm. Normal S1 and S2. No gallop, click, or rub. S4 with slurring    Abdomen: Bowel sounds normal; abdomen soft and nontender. No masses, organomegaly or hernias noted. No AAA or renal artery bruits                                                                            Musculoskeletal/extremities:  No clubbing, cyanosis noted. Trace edema @ sock line Vascular: Carotid, radial artery, dorsalis pedis and  posterior tibial pulses are full and equal.  Neurologic: Alert and oriented x3. Skin: Intact without suspicious lesions or rashes. Lymph: No cervical, axillary lymphadenopathy present. Psych: Mood and affect are normal. Normally interactive                                                                                         Assessment &  Plan:  #1 epistaxis, multifactorial. The nasal septum drying is a predisposition. It is likely that the licorice did raise the blood pressure.  #2 chronic back pain treated with meloxicam. There may be  long-term cardiovascular and GI risks with this medication. Perhaps he can be treated with low-dose fentanyl patch. I asked him to discuss this with Dr Newell Coral

## 2012-02-10 ENCOUNTER — Ambulatory Visit (INDEPENDENT_AMBULATORY_CARE_PROVIDER_SITE_OTHER): Payer: Medicare Other | Admitting: Cardiovascular Disease

## 2012-02-10 ENCOUNTER — Encounter: Payer: Self-pay | Admitting: Cardiovascular Disease

## 2012-02-10 VITALS — BP 140/80 | HR 85 | Ht 74.0 in | Wt 303.8 lb

## 2012-02-10 DIAGNOSIS — E785 Hyperlipidemia, unspecified: Secondary | ICD-10-CM

## 2012-02-10 DIAGNOSIS — R413 Other amnesia: Secondary | ICD-10-CM

## 2012-02-10 DIAGNOSIS — I4891 Unspecified atrial fibrillation: Secondary | ICD-10-CM

## 2012-02-10 DIAGNOSIS — I10 Essential (primary) hypertension: Secondary | ICD-10-CM

## 2012-02-10 DIAGNOSIS — I480 Paroxysmal atrial fibrillation: Secondary | ICD-10-CM

## 2012-02-10 MED ORDER — LOSARTAN POTASSIUM-HCTZ 100-25 MG PO TABS: 1.0000 | ORAL_TABLET | Freq: Every day | ORAL | Status: DC

## 2012-02-10 NOTE — Assessment & Plan Note (Signed)
Cholesterol is at goal.  Continue current dose of statin and diet Rx.  No myalgias or side effects.  F/U  LFT's in 6 months. Lab Results  Component Value Date   LDLCALC 85 10/28/2011

## 2012-02-10 NOTE — Assessment & Plan Note (Signed)
Maint NSR with no palpitations.  PRN cardiology F/U

## 2012-02-10 NOTE — Patient Instructions (Signed)
Your physician recommends that you schedule a follow-up appointment in: AS NEEDED Your physician has recommended you make the following change in your medication: STOP RAMIPRIL , HCTZ ( hYDROCHLOROTHIAZIDE ) AND ASPIRIN  START  LOSARTAN / HCTZ 100/25 MG 1 EVERY DAY

## 2012-02-10 NOTE — Progress Notes (Signed)
Patient ID: [name], male   DOB: [birth year], 71 y.o.   MRN: [id number] 69 seen post hospital. Had lumbar surgery with PAF. Maint NSR since D/C. History of elevated lipids and hypertension No sSCP, palpitations since D/C. Obese with sleep apnea and some dementia. Wearing back brace. Compliant with meds. History difficult due to dementia.  Stopped licorice Still has headache  ROS: Denies fever, malais, weight loss, blurry vision, decreased visual acuity, cough, sputum, SOB, hemoptysis, pleuritic pain, palpitaitons, heartburn, abdominal pain, melena, lower extremity edema, claudication, or rash.  All other systems reviewed and negative  General: Affect appropriate Healthy:  appears stated age HEENT: normal Neck supple with no adenopathy JVP normal no bruits no thyromegaly Lungs clear with no wheezing and good diaphragmatic motion Heart:  S1/S2 no murmur, no rub, gallop or click PMI normal Abdomen: benighn, BS positve, no tenderness, no AAA no bruit.  No HSM or HJR Distal pulses intact with no bruits No edema Neuro non-focal Skin warm and dry No muscular weakness   Current Outpatient Prescriptions  Medication Sig Dispense Refill  . Armodafinil (NUVIGIL) 150 MG tablet Take 150 mg by mouth daily.      Marland Kitchen aspirin 325 MG tablet Take 325 mg by mouth daily.        . ciprofloxacin-dexamethasone (CIPRODEX) otic suspension 4 drops 2 (two) times daily as needed.       . fish oil-omega-3 fatty acids 1000 MG capsule Take 1 capsule by mouth daily.        . Flaxseed, Linseed, (FLAX SEED OIL) 1000 MG CAPS Take 1 capsule by mouth daily.        Marland Kitchen gabapentin (NEURONTIN) 300 MG capsule Take 300 mg by mouth every 8 (eight) hours as needed.        . hydrochlorothiazide (MICROZIDE) 12.5 MG capsule Take 1 capsule (12.5 mg total) by mouth daily.  90 capsule  3  . meloxicam (MOBIC) 7.5 MG tablet Take 7.5 mg by mouth 2 (two) times daily.        . memantine (NAMENDA) 10 MG tablet Take 10 mg by mouth 2 (two)  times daily.        . Multiple Vitamin (MULTIVITAMIN) tablet Take 1 tablet by mouth daily.        . NON FORMULARY ALLERGY INJECTIONS. 1 weekly.       . ramipril (ALTACE) 10 MG capsule Take 1 capsule (10 mg total) by mouth daily.  90 capsule  3  . simvastatin (ZOCOR) 40 MG tablet Take 1 tablet (40 mg total) by mouth at bedtime.  90 tablet  3  . vardenafil (LEVITRA) 20 MG tablet Take 20 mg by mouth daily as needed.          Allergies  Other; Rosuvastatin; Tadalafil; and Tramadol hcl  Electrocardiogram:  Assessment and Plan

## 2012-02-10 NOTE — Assessment & Plan Note (Signed)
Progressive Namenda no help  F/U Hopper and neuro

## 2012-02-10 NOTE — Assessment & Plan Note (Signed)
Change ramipril/HCTZ to Hyzaar.  Exercise and weight loss discussed.  Minimize NSAI's.  F/U Alwyn Ren

## 2012-03-01 ENCOUNTER — Other Ambulatory Visit: Payer: Self-pay | Admitting: Cardiovascular Disease

## 2012-03-01 MED ORDER — LOSARTAN POTASSIUM-HCTZ 100-25 MG PO TABS: 1.0000 | ORAL_TABLET | Freq: Every day | ORAL | Status: DC

## 2012-03-01 NOTE — Telephone Encounter (Signed)
Refill- losartan-hydrochlorothiazide (HYZAAR) 100-25 MG per tablet (90 Day Supply)   Verified preferred [facility] home Delivery, patient can be reached at [phone] for additional questions.

## 2012-03-07 ENCOUNTER — Telehealth: Payer: Self-pay | Admitting: Cardiovascular Disease

## 2012-03-07 MED ORDER — LOSARTAN POTASSIUM-HCTZ 100-25 MG PO TABS: 1.0000 | ORAL_TABLET | Freq: Every day | ORAL | Status: DC

## 2012-03-07 NOTE — Telephone Encounter (Signed)
Please return call to patient at [phone] regarding Losartan medication, which he says is doing very well for him at this point.

## 2012-03-07 NOTE — Telephone Encounter (Signed)
SCRIPT FOR  GEN HYZAAR 100-25 MG  NO. 90 WITH  3 REFILLS  PHONED I NTO [facility]  HOME DELIVERY   AT PT'S REQUEST ./CY  PT  AWARE  REFILLS  DONE VIA PHONE SEE ABOVE .Zack Seal

## 2012-03-27 IMAGING — CT CT HEAD W/O CM
1 of 2 series · 13 of 30 positions shown, 17 images · non-contrast
Comparison: 04/10/2009 CT.  11 07/03/2010 MR.

CLINICAL DATA: Post lumbar spine surgery with confusion and fever.

CT HEAD WITHOUT CONTRAST
TECHNIQUE: Contiguous axial images were obtained from the base of
the skull through the vertex without contrast.

[Series 2: brain · axial · 0.49mm/px · z∈[+147,+280]mm · 13 of 32 slices shown, 17 images]
[im 3/32  brain]
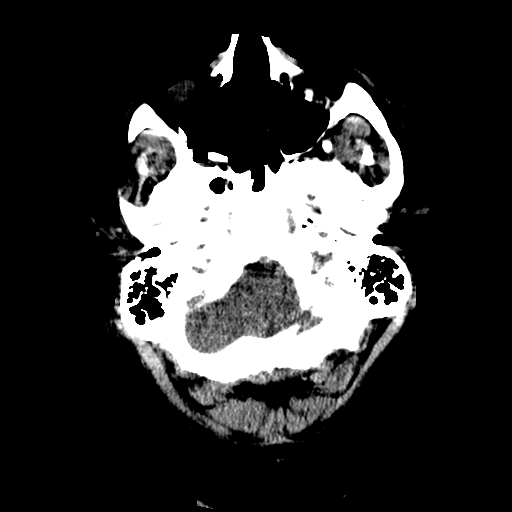
[im 3/32  bone]
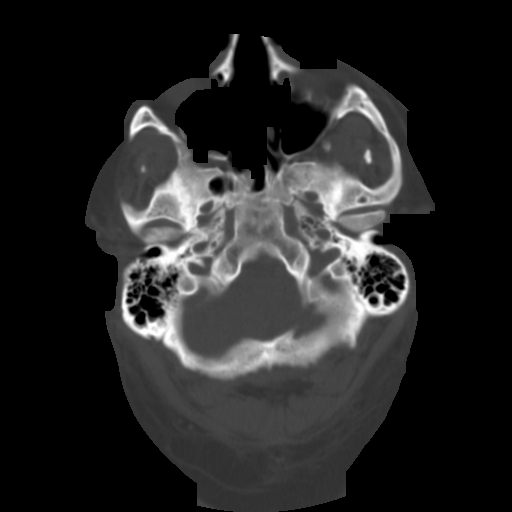
[im 5/32  brain]
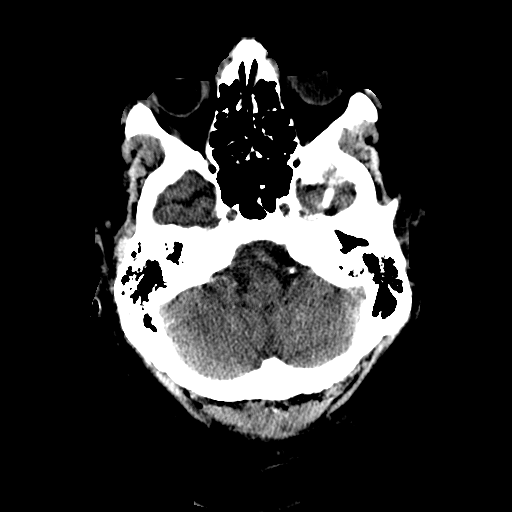
[im 7/32  brain]
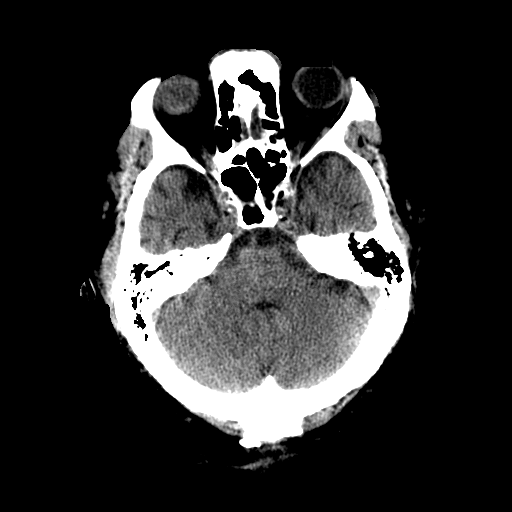
[im 9/32  brain]
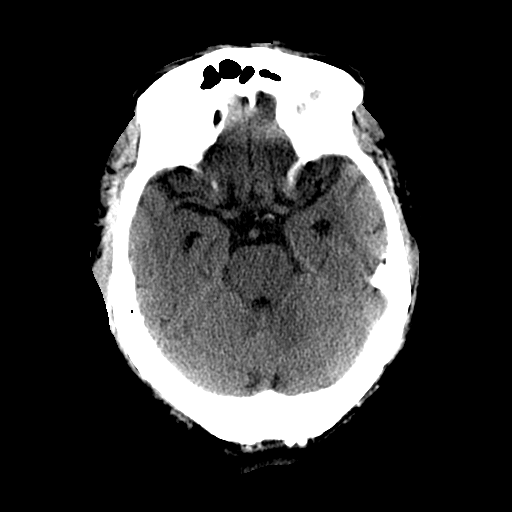
[im 12/32  brain]
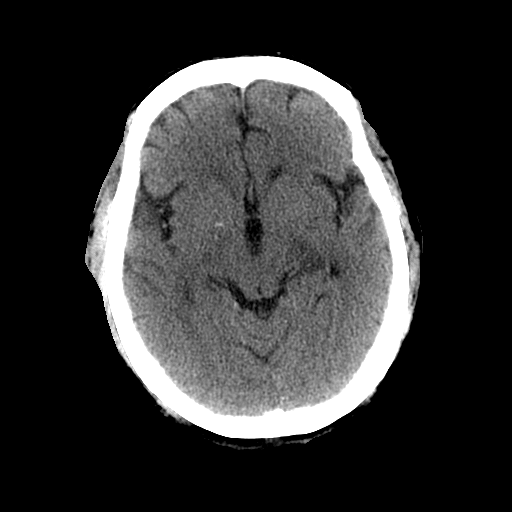
[im 12/32  bone]
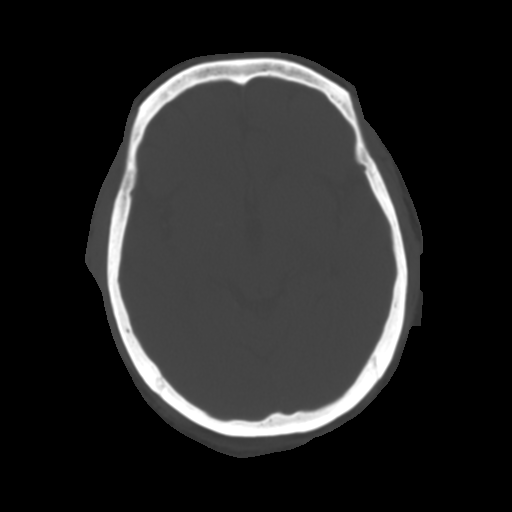
[im 14/32  brain]
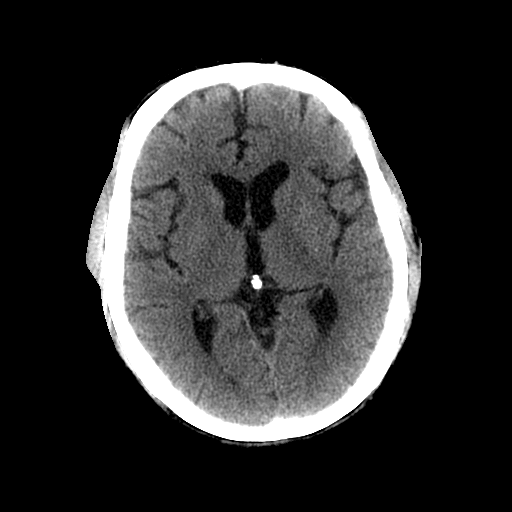
[im 16/32  brain]
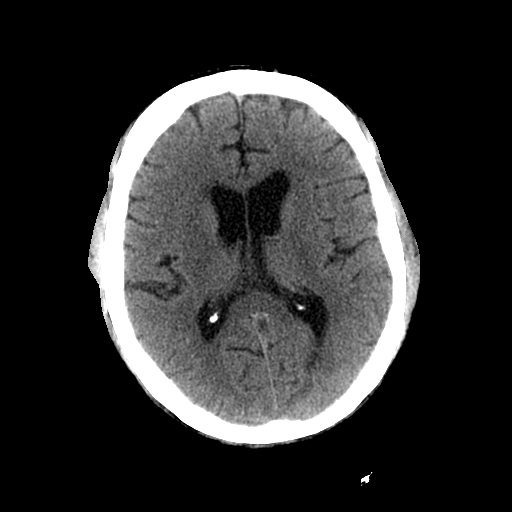
[im 18/32  brain]
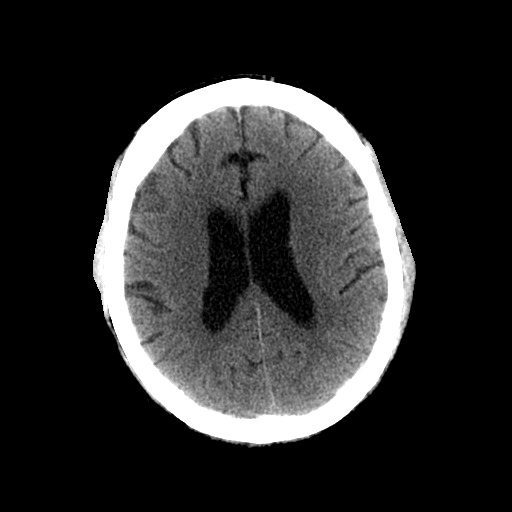
[im 20/32  brain]
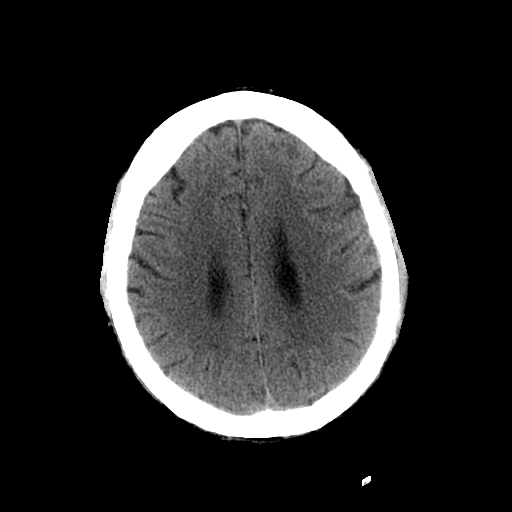
[im 20/32  bone]
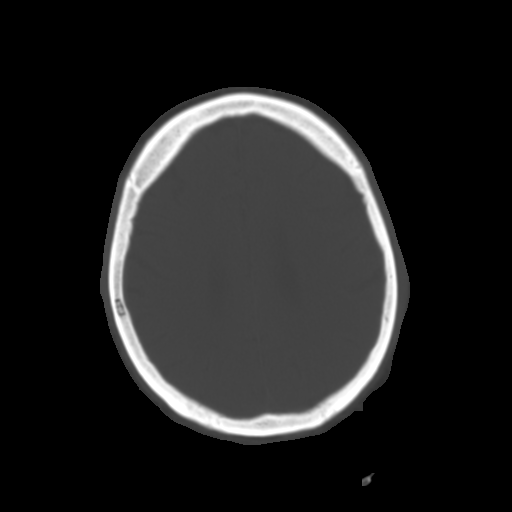
[im 23/32  brain]
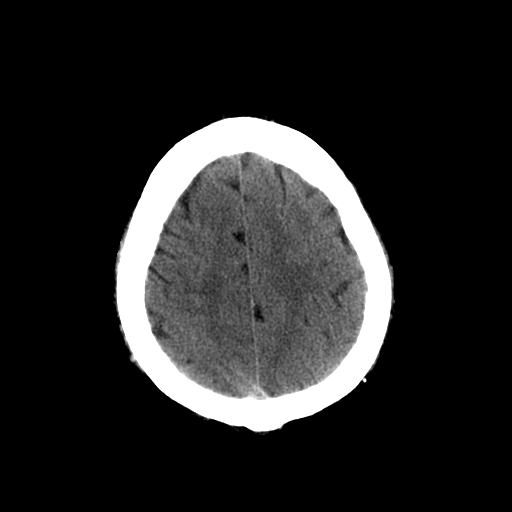
[im 25/32  brain]
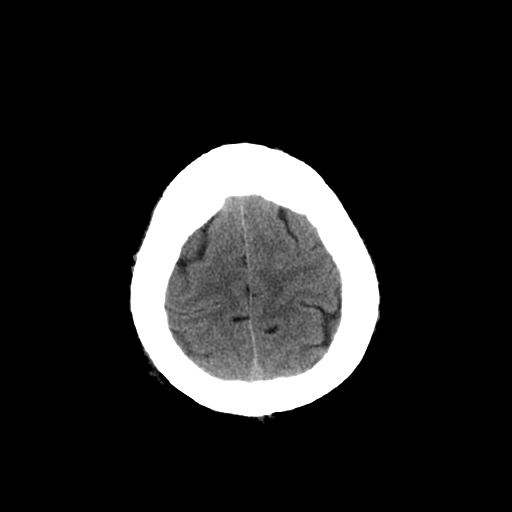
[im 27/32  brain]
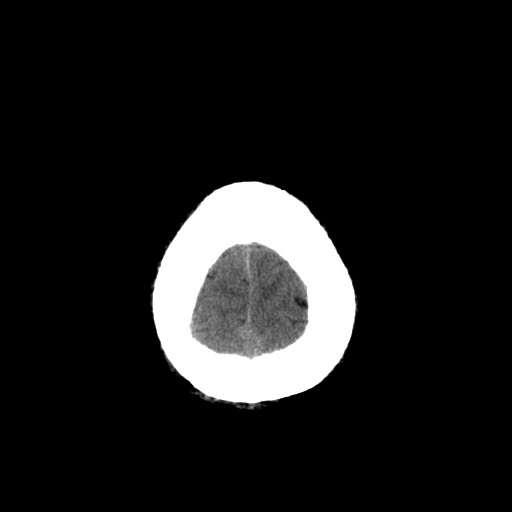
[im 29/32  brain]
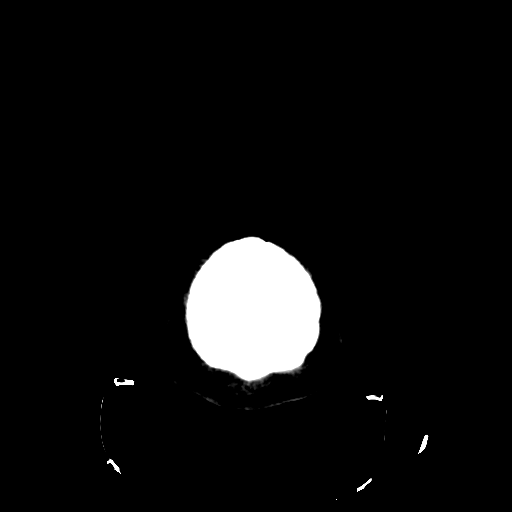
[im 29/32  bone]
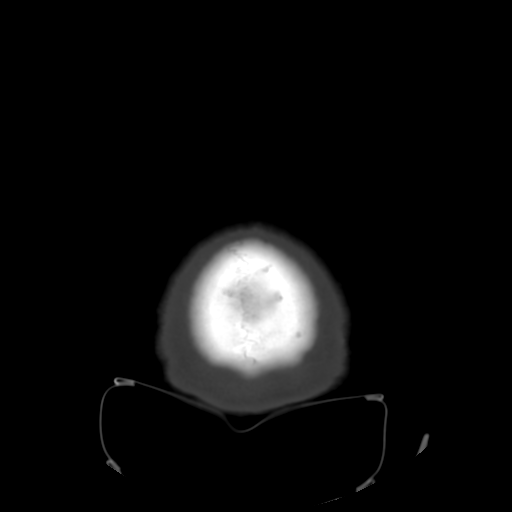

[13 of 30 positions shown; findings below may reference images not displayed]

FINDINGS: No intracranial hemorrhage.

No CT evidence of large acute infarct.  Small acute infarct cannot
be excluded by CT.

No intracranial mass lesion detected on this unenhanced exam.

Minimal small vessel disease type changes.

No hydrocephalus

Minimal mucosal thickening maxillary sinuses.
IMPRESSION: No intracranial hemorrhage or CT evidence of large acute infarct.

Minimal maxillary sinus mucosal thickening.

## 2012-03-27 IMAGING — CR DG CHEST 2V
2 series · 2 of 2 positions shown · non-contrast
Comparison: 04/14/2011.

CLINICAL DATA: Postop fever and confusion.

CHEST - 2 VIEW

[w chest pa]
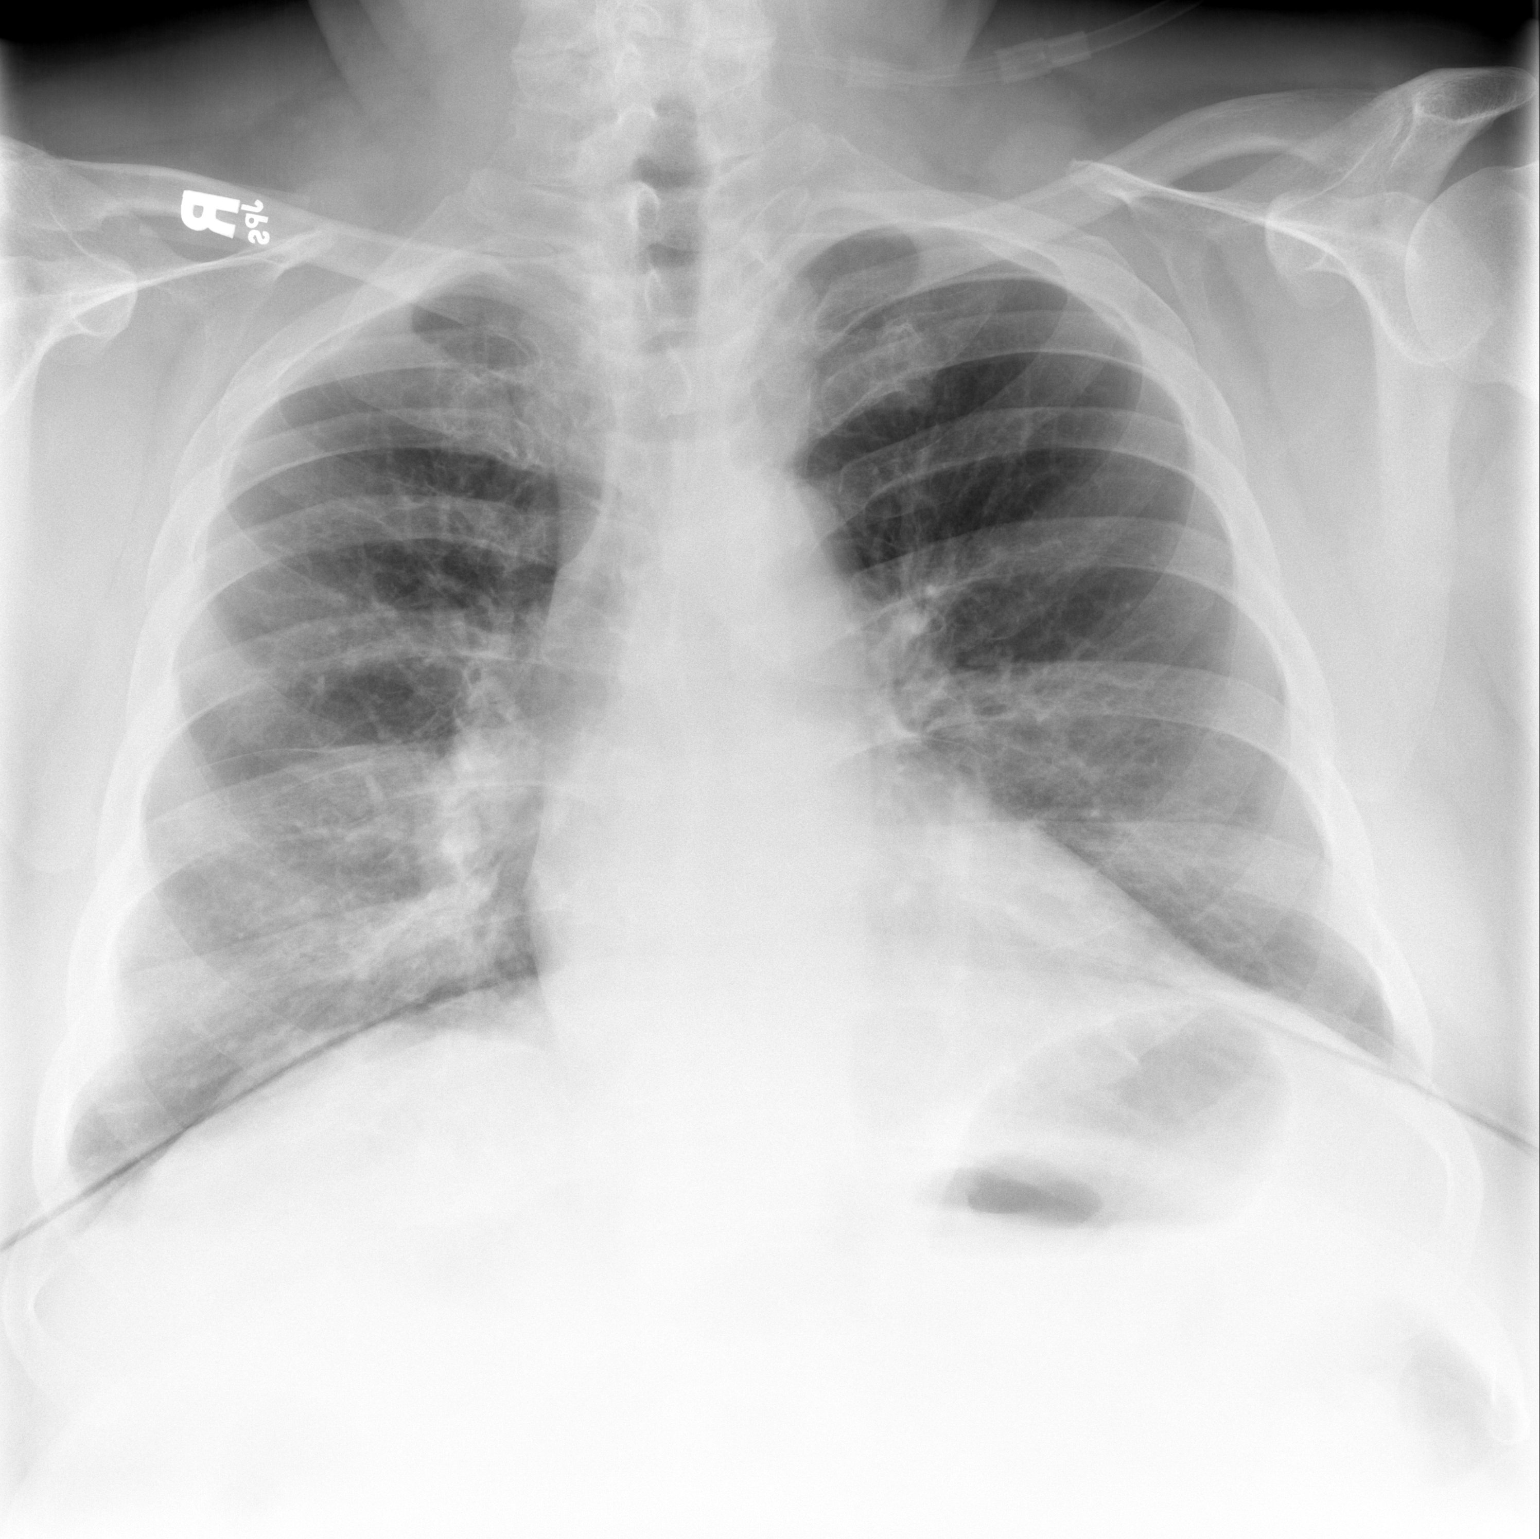

[w chest lat]
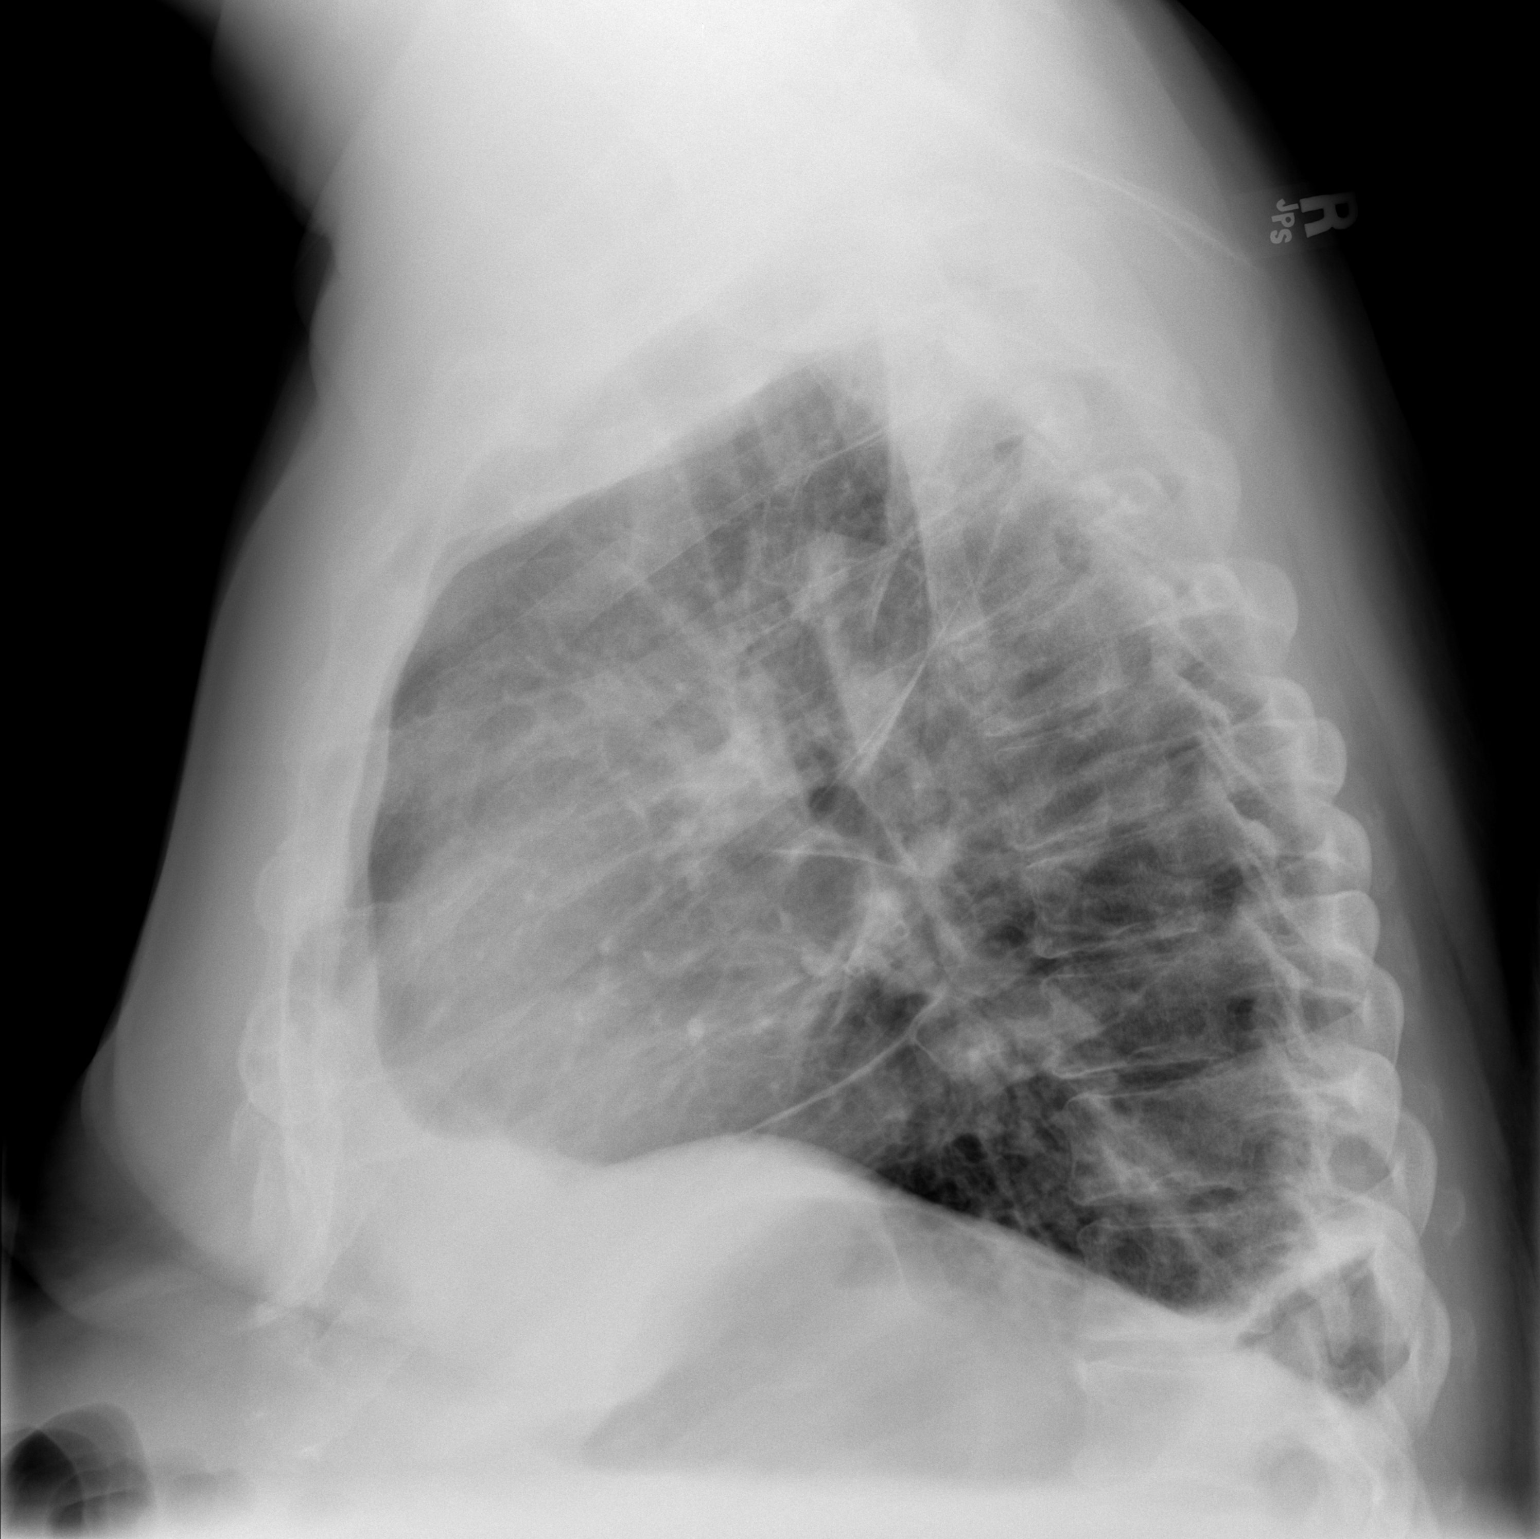

[2 of 2 positions shown; findings below may reference images not displayed]

FINDINGS: Trachea is midline.  Heart size normal.  There is new air
space consolidation in the medial aspect of the lower right
hemithorax.  Left lung is clear.  No pleural fluid.
IMPRESSION: New air space consolidation in the medial right lung base
suspicious for pneumonia.

## 2012-03-29 IMAGING — CR DG CHEST 1V PORT
1 series · 1 of 1 positions shown · non-contrast
Comparison: 04/17/2011

CLINICAL DATA: Follow up pneumonia

PORTABLE CHEST - 1 VIEW

[view not recorded]
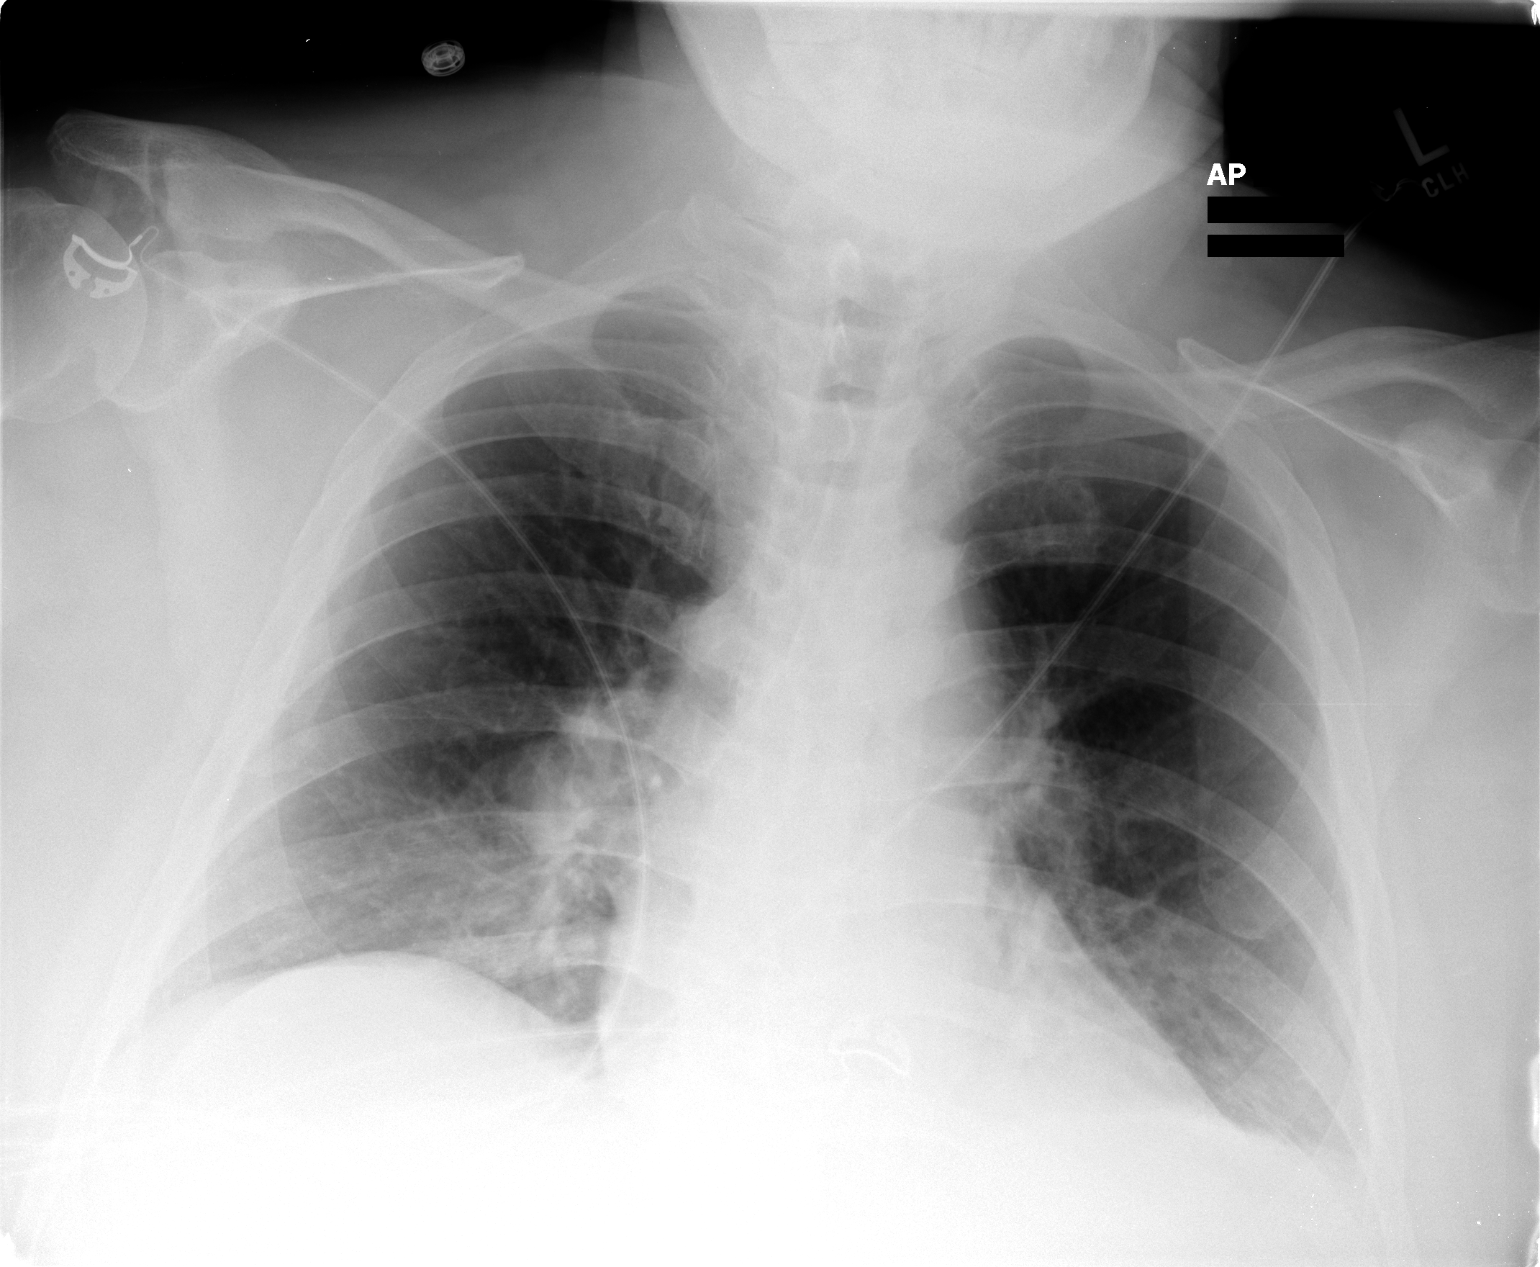

[1 of 1 positions shown; findings below may reference images not displayed]

FINDINGS: Medial right lung base opacity is unchanged.

Heart size appears normal.

There are low lung volumes and bibasilar atelectasis.
IMPRESSION: 1.  No significant change in the right medial lung base opacity.
2.  Low lung volumes and bibasilar atelectasis

## 2012-04-01 IMAGING — CR DG KNEE 1-2V*L*
2 series · 2 of 2 positions shown · non-contrast
Comparison: None.

CLINICAL DATA: Pain, no known injury

LEFT KNEE - 1-2 VIEW

[t knee ap left]
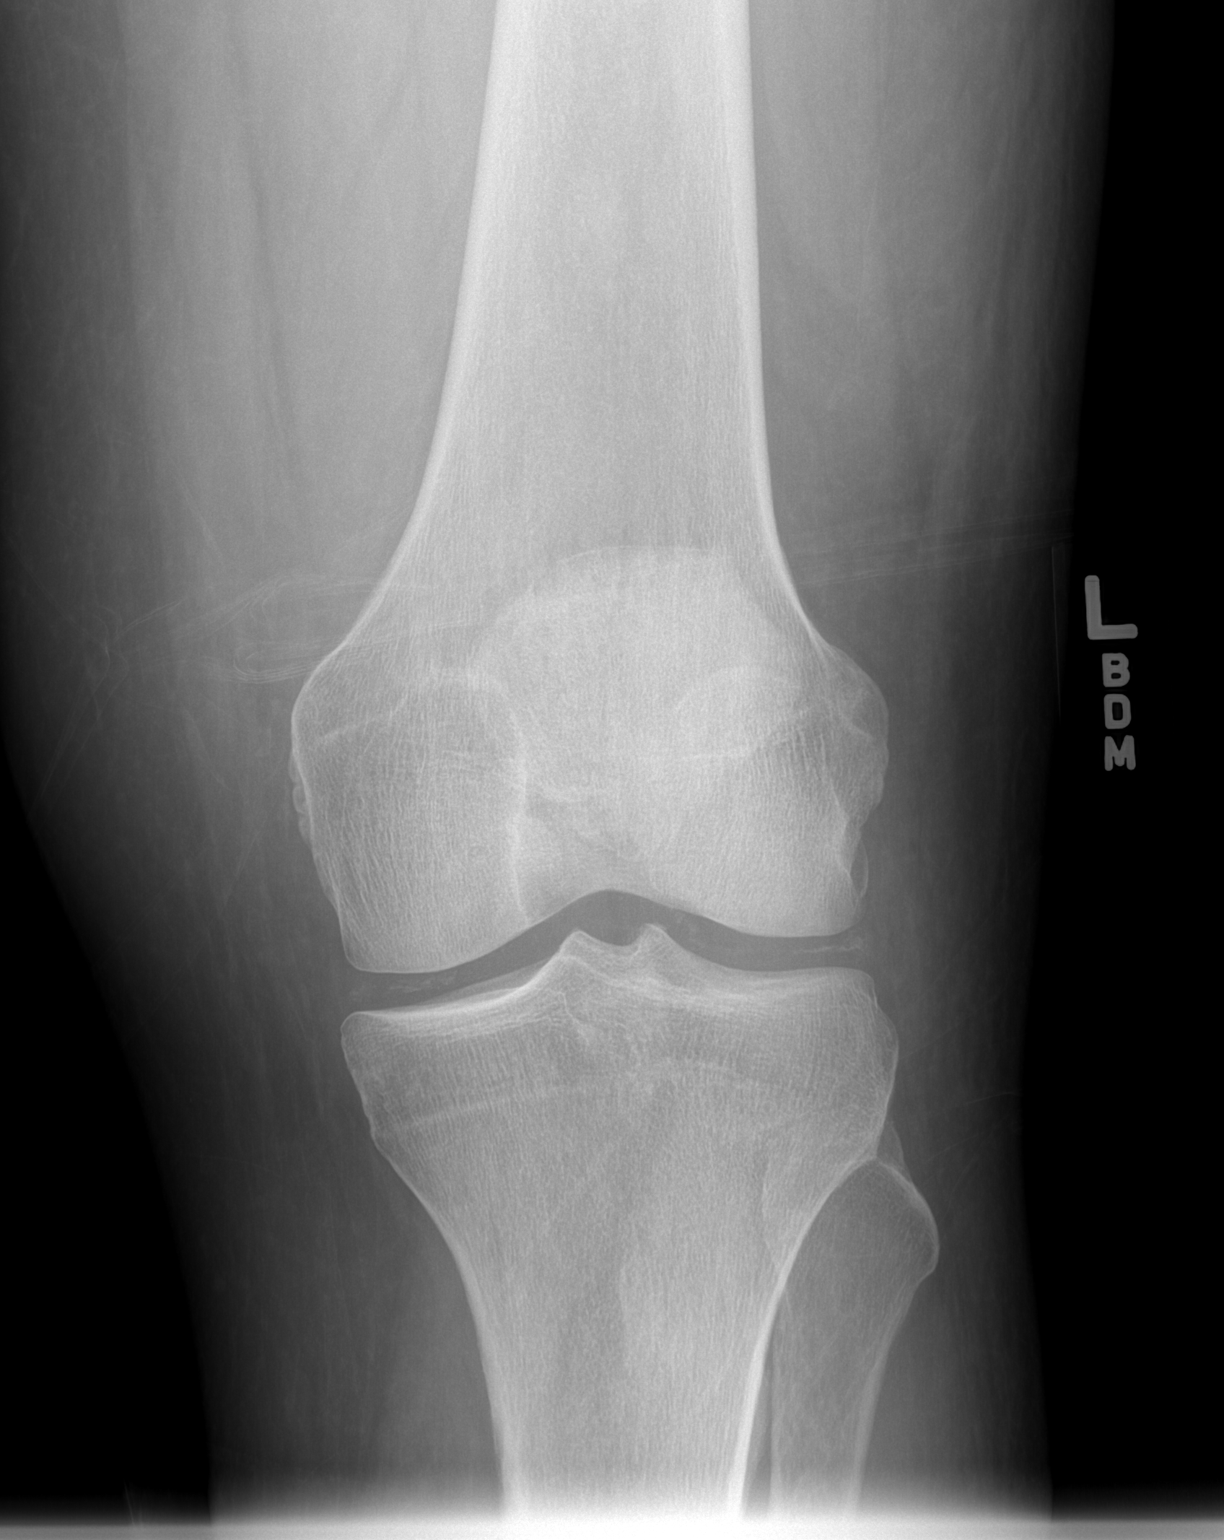

[t knee lat left]
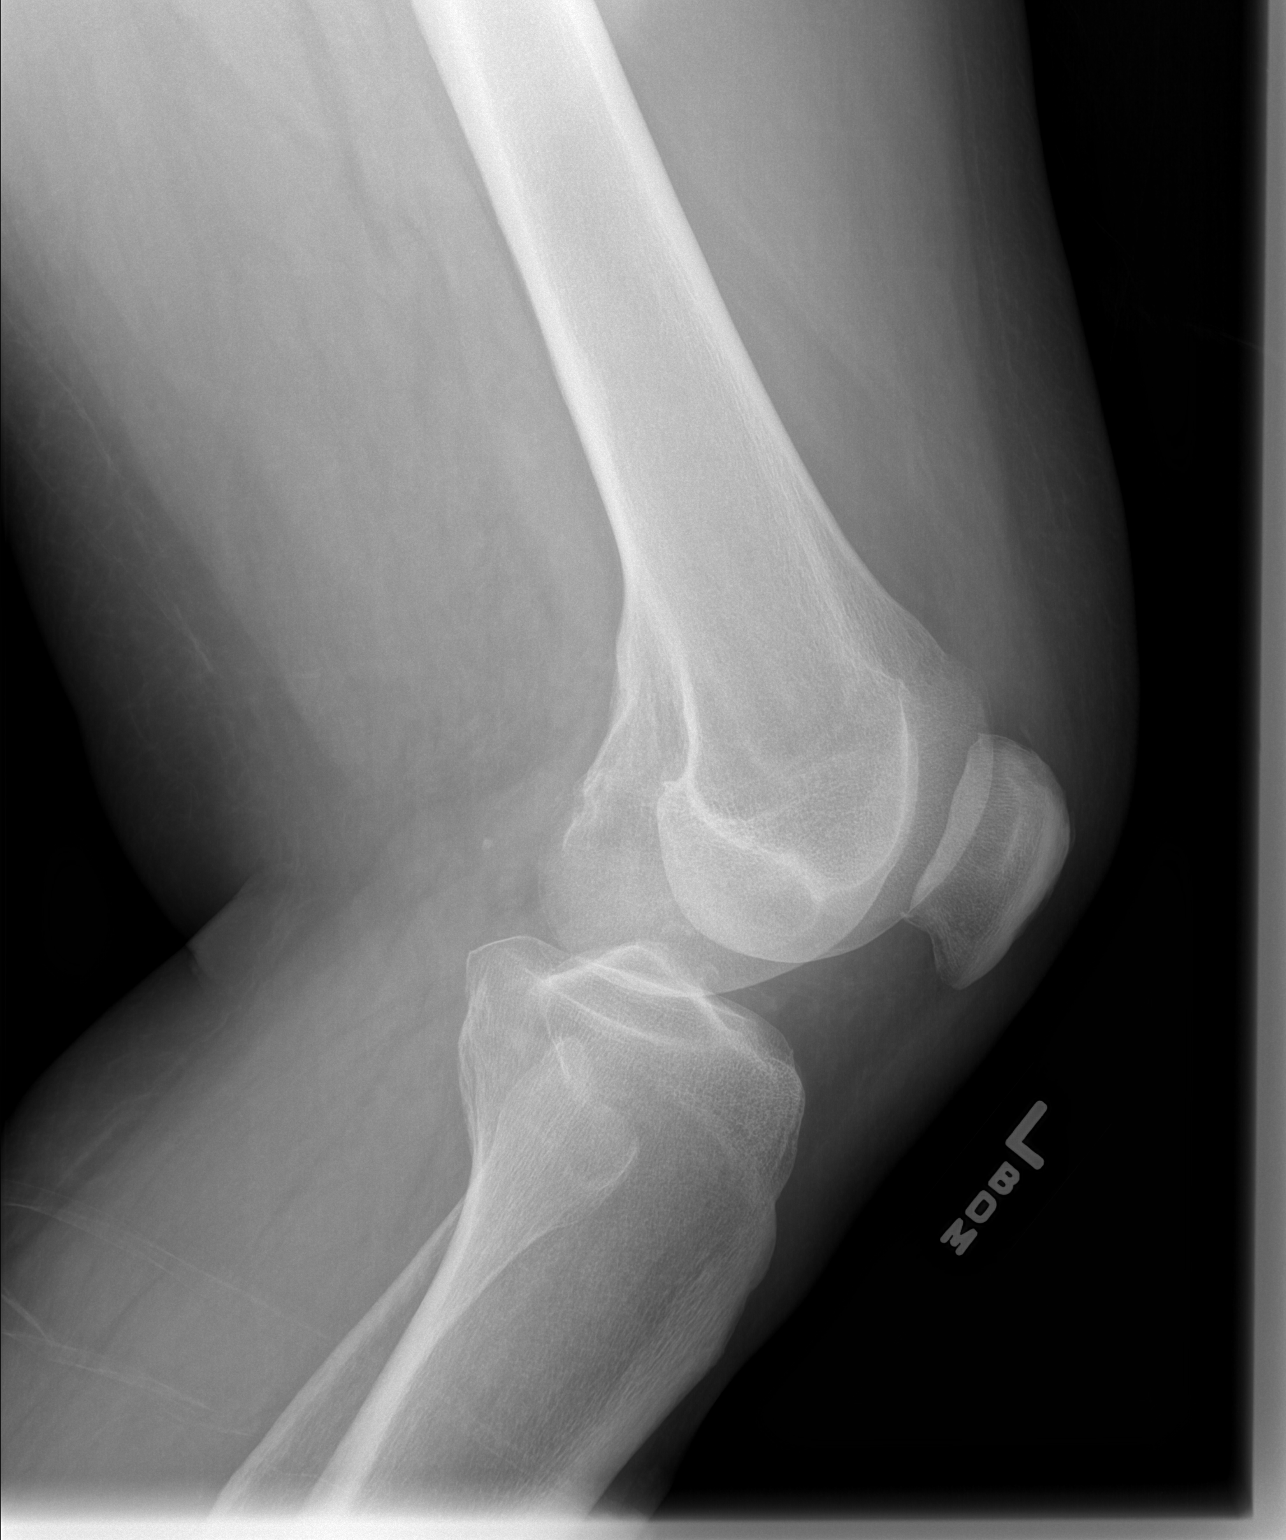

[2 of 2 positions shown; findings below may reference images not displayed]

FINDINGS: No visible fracture or subluxation. No significant joint
space narrowing.  No focal osseous lesions. Medial and lateral
joint space calcification.  Small effusion.
IMPRESSION: Medial and lateral joint space calcification.  Small suprapatellar
bursa effusion.  No fracture or dislocation.

## 2012-04-12 ENCOUNTER — Telehealth: Payer: Self-pay | Admitting: Cardiovascular Disease

## 2012-04-12 ENCOUNTER — Other Ambulatory Visit: Payer: Self-pay

## 2012-04-12 MED ORDER — HYDROCHLOROTHIAZIDE 25 MG PO TABS: 25.0000 mg | ORAL_TABLET | Freq: Every day | ORAL | Status: DC

## 2012-04-12 MED ORDER — ENALAPRIL MALEATE 20 MG PO TABS: 20.0000 mg | ORAL_TABLET | Freq: Every day | ORAL | Status: DC

## 2012-04-12 NOTE — Telephone Encounter (Signed)
New problem:  Patient calling C/O side effect from new medication. Leg are swollen.

## 2012-04-12 NOTE — Telephone Encounter (Signed)
Patient called was told Dr.Nishan advised to stop losartan/hctz,and go back to old meds but he increased dose,enlapril 20 mg daily and hctz 25 mg daily.

## 2012-04-12 NOTE — Telephone Encounter (Signed)
Go back to old meds but higher dose  Enalapril 20 and HCTZ 25mg   Stop losartan/HCTZ

## 2012-04-12 NOTE — Telephone Encounter (Signed)
Patient called stated started on Losartan/HCT 100/25 mg 03/10/12.States has noticed since starting his lower legs are swelling, rt is a little worse and lf lower leg tingles.Also has noticed he is a little sob.Patient was told Dr.Nishan out of office today.Will forward message to Dr.Nishan for his advice.

## 2012-05-02 ENCOUNTER — Encounter: Payer: Self-pay | Admitting: Internal Medicine

## 2012-05-02 ENCOUNTER — Ambulatory Visit (INDEPENDENT_AMBULATORY_CARE_PROVIDER_SITE_OTHER): Payer: Medicare Other | Admitting: Internal Medicine

## 2012-05-02 VITALS — BP 148/70 | HR 76 | Temp 98.1°F | Wt 310.6 lb

## 2012-05-02 DIAGNOSIS — R609 Edema, unspecified: Secondary | ICD-10-CM

## 2012-05-02 DIAGNOSIS — M79609 Pain in unspecified limb: Secondary | ICD-10-CM

## 2012-05-02 DIAGNOSIS — I10 Essential (primary) hypertension: Secondary | ICD-10-CM

## 2012-05-02 DIAGNOSIS — M79669 Pain in unspecified lower leg: Secondary | ICD-10-CM

## 2012-05-02 DIAGNOSIS — R600 Localized edema: Secondary | ICD-10-CM

## 2012-05-02 LAB — HEPATIC FUNCTION PANEL
ALT: 36 U/L (ref 0–53)
AST: 32 U/L (ref 0–37)
Alkaline Phosphatase: 52 U/L (ref 39–117)
Bilirubin, Direct: 0.1 mg/dL (ref 0.0–0.3)
Total Bilirubin: 0.6 mg/dL (ref 0.3–1.2)
Total Protein: 7.5 g/dL (ref 6.0–8.3)

## 2012-05-02 LAB — BASIC METABOLIC PANEL
CO2: 29 mEq/L (ref 19–32)
Chloride: 100 mEq/L (ref 96–112)
Potassium: 4.4 mEq/L (ref 3.5–5.1)
Sodium: 137 mEq/L (ref 135–145)

## 2012-05-02 MED ORDER — FUROSEMIDE 40 MG PO TABS: 40.0000 mg | ORAL_TABLET | Freq: Every day | ORAL | Status: DC

## 2012-05-02 NOTE — Patient Instructions (Addendum)
Blood Pressure Goal  Ideally is an AVERAGE < 135/85. This AVERAGE should be calculated from @ least 5-7 BP readings taken @ different times of day on different days of week. You should not respond to isolated BP readings , but rather the AVERAGE for that week  

## 2012-05-02 NOTE — Progress Notes (Signed)
  Subjective:    Patient ID: [name], male    DOB: 1941/06/16, 71 y.o.   MRN: [id number]  HPI He's had edema of the extremities, right greater than left for approximately a month. This is associated with discomfort in both calves. Because of suboptimal blood pressure control he was switched from Ramipril  and hydrochlorothiazide 12.5 mg to  losartan/HCT 100/25.  Significantly he had an episode of paroxysmal  Tachycardia following surgery of the lumbosacral spine 04/2011.  He has no past medical history of deep venous versus pulmonary thromboemboli. Review of prior labs reveals normal renal and hepatic function.    Review of Systems Describes exertional dyspnea and some lower leg discomfort with ambulation. He denies chest pain, palpitations, or paroxysmal nocturnal dyspnea. He describes random numbness and tingling in the left leg from the knee down.     Objective:   Physical Exam Gen.: well-nourished in appearance. Alert, appropriate and cooperative throughout exam. His wife, who is usual history provider, is not present  Eyes: No corneal or conjunctival inflammation noted.Ears: External  ear exam reveals no significant lesions or deformities. Canals clear .TMs normal. Hearing is grossly normal bilaterally.  Neck: No deformities, masses, or tenderness noted.  Thyroid normal. No neck vein distention at 10 Lungs: Normal respiratory effort; chest expands symmetrically. Lungs are clear to auscultation without rales, wheezes, or increased work of breathing. Heart: Normal rate and rhythm. Normal S1 and S2. No gallop, click, or rub.S4 w/o murmur. Abdomen: Bowel sounds normal; abdomen soft and nontender. No masses, organomegaly or hernias noted. No hepatojugular reflux present                                                                           Musculoskeletal/extremities: One half-1+ edema right lower extremity. 1/2+ edema left lower extremity. There is asymmetry of the gastrocnemius  with the right slightly deformed and larger than the left. Homans sign is equivocal Vascular: Carotid, radial artery, dorsalis pedis and  posterior tibial pulses are full and equal. No bruits present. Neurologic:  Deep tendon reflexes asymmetric; reflexes not elicitable at left knee. Other reflexes one half-1+.          Skin: Intact without suspicious lesions or rashes. Some stasis dermatitis changes of the lower extremities , especially right leg. Varicosities right lower extremity greater than left Lymph: No cervical, axillary, or inguinal lymphadenopathy present. Psych: Mood and affect are normal. Normally interactive                                                                                        Assessment & Plan:  #1 edema, chronic, asymmetric  #2 calf discomfort with equivocal Homans.  #3 hypertension, fair to good control  Plan: See orders and recommendations

## 2012-05-03 ENCOUNTER — Encounter (INDEPENDENT_AMBULATORY_CARE_PROVIDER_SITE_OTHER): Payer: Medicare Other

## 2012-05-03 ENCOUNTER — Telehealth: Payer: Self-pay

## 2012-05-03 DIAGNOSIS — R600 Localized edema: Secondary | ICD-10-CM

## 2012-05-03 DIAGNOSIS — M79669 Pain in unspecified lower leg: Secondary | ICD-10-CM

## 2012-05-03 DIAGNOSIS — R609 Edema, unspecified: Secondary | ICD-10-CM

## 2012-05-03 MED ORDER — RIVAROXABAN 15 MG PO TABS: 15.0000 mg | ORAL_TABLET | Freq: Two times a day (BID) | ORAL | Status: DC

## 2012-05-03 NOTE — Telephone Encounter (Signed)
I spoke with patient's wife [name] and informed her of Dr.Hopper's plan to have patient start Xarelto.

## 2012-05-03 NOTE — Telephone Encounter (Signed)
Positive DVT, per Dr.Hopper Xarelto with foods x 21 days (3 weeks)

## 2012-05-04 ENCOUNTER — Telehealth: Payer: Self-pay | Admitting: Internal Medicine

## 2012-05-04 NOTE — Telephone Encounter (Signed)
He was started on Xarelto mg twice a day which will be continued X 6 weeks; Sharyl Nimrod needs to bring him in today , tomorrow or Fri  to discuss long-term plan

## 2012-05-04 NOTE — Telephone Encounter (Signed)
Discuss with [name] Pt schedule for OV Friday.

## 2012-05-04 NOTE — Telephone Encounter (Signed)
Caller: [name]/Spouse; PCP: Marga Melnick; CB#: [phone]; ; ; Call regarding Blood Clots Legs;  Dx blood clots in both legs about 4:30 pm yesterday  05/03/2012   and spoke with Dr Alwyn Ren after Dx.  Started new Rx thinks starting with X. Has questions:  Dr Alwyn Ren changed from HCTZ to Lasix on Mon 7/29  -- is this to continue? Cardiologist stopped ASA 325 mg QD  about 2 months ago  - does he need add back in, change this? Deep vein thrombosis - does he need compression stockings?    Can he be up and walking  as usual as long as there is no injury?  What is the cause of thrombosis requested by son/MD.  Askiing if  inactivity, dementia  or some other reason ? Want next ?   Needs plan for care.  When to return to office. PLEASE CONTACT MER[facility]   [phone].

## 2012-05-06 ENCOUNTER — Encounter: Payer: Self-pay | Admitting: Internal Medicine

## 2012-05-06 ENCOUNTER — Ambulatory Visit (INDEPENDENT_AMBULATORY_CARE_PROVIDER_SITE_OTHER): Payer: Medicare Other | Admitting: Internal Medicine

## 2012-05-06 VITALS — BP 140/80 | HR 77 | Temp 98.5°F | Wt 309.8 lb

## 2012-05-06 DIAGNOSIS — I82409 Acute embolism and thrombosis of unspecified deep veins of unspecified lower extremity: Secondary | ICD-10-CM | POA: Insufficient documentation

## 2012-05-06 MED ORDER — RIVAROXABAN 15 MG PO TABS: 15.0000 mg | ORAL_TABLET | Freq: Two times a day (BID) | ORAL | Status: DC

## 2012-05-06 NOTE — Patient Instructions (Addendum)
Measure calf circumference daily. Report warning signs as discussed (chest pain, shortness of breath, hemoptysis). Stop the  meloxicam.Blood Pressure Goal  Ideally is an AVERAGE < 135/85. This AVERAGE should be calculated from @ least 5-7 BP readings taken @ different times of day on different days of week. You should not respond to isolated BP readings , but rather the AVERAGE for that week  Eat a low-fat diet with lots of fruits and vegetables, up to 7-9 servings per day. Consume less than 40 (preferably ZERO) grams of sugar per day from foods & drinks with High Fructose Corn Syrup (HFCS) sugar as #1,2,3 or # 4 on label.Whole [employer], [employer] & [employer] do not carry products with HFCS. Follow a  low carb nutrition program such as [place] or [employer]  to prevent Diabetes progression . White carbohydrates (potatoes, rice, bread, and pasta) have a high spike of sugar and a high load of sugar. For example a  baked potato has a cup of sugar and a  french fry  2 teaspoons of sugar. Yams, wild  rice, whole grained bread &  wheat pasta have been much lower spike and load of  sugar. Portions should be the size of a deck of cards or your palm.  Please schedule appt in 5 weeks

## 2012-05-06 NOTE — Progress Notes (Signed)
  Subjective:    Patient ID: [name], male    DOB: 02-04-1941, 71 y.o.   MRN: [id number]  HPI He continues to have edema in the extremities with tenderness, particularly to palpation or pressure. He specifically denies chest pain, dyspnea, or hemoptysis. He denies any predisposition to deep venous thrombosis such as injury, immobilization or prolonged travel.  He has no history of deep venous thrombosis or pulmonary emboli; family history is negative as well.  His wife questions whether he may have had phlebitis in the past; he denies this.    Review of Systems Because of the edema; hydrochlorothiazide was changed to furosemide. He has not been monitoring his blood pressure since this change was made.     Objective:   Physical Exam He appears well-nourished and in no distress  Chest is clear with no increased work of breathing, rhonchi, rales, or rubs  He has a regular rhythm with an S4. There is a grade 1/2-1 systolic murmur.  He has no organomegaly, masses, aortic aneurysm,or abdominal tenderness.  18 cm below the inferior patellar margin, the right calf is 47 cm in circumference and the left 41 cm. Homans sign is equivocally positive bilaterally. He has 1+ pitting edema of the feet and ankles. There is plethora of the feet          Assessment & Plan:  #1 deep venous thrombosis; clinically there is no evidence of pulmonary thromboemboli based on history, exam, and O2 sat.  Plan: Xarelto 15 mg  continued twice a day for 6 weeks and then switched to 20 mg once daily. Once he is off his medication; a myelopathy profile will be performed.

## 2012-06-04 ENCOUNTER — Other Ambulatory Visit: Payer: Self-pay | Admitting: Cardiovascular Disease

## 2012-06-10 ENCOUNTER — Ambulatory Visit: Payer: Medicare Other | Admitting: Internal Medicine

## 2012-06-14 ENCOUNTER — Other Ambulatory Visit: Payer: Self-pay | Admitting: Internal Medicine

## 2012-06-14 NOTE — Telephone Encounter (Signed)
This dose of 15 mg twice a day should be continued for a total of 6 weeks. It appears he has only taken it for 3 weeks  as of this date. It should be refilled for 3 more weeks and then switch to 20 mg daily dispense 30 refill x2

## 2012-06-14 NOTE — Telephone Encounter (Signed)
Dr.Hopper please advise 

## 2012-06-24 ENCOUNTER — Ambulatory Visit (INDEPENDENT_AMBULATORY_CARE_PROVIDER_SITE_OTHER): Payer: Medicare Other | Admitting: Internal Medicine

## 2012-06-24 VITALS — BP 136/72 | HR 91 | Temp 98.0°F

## 2012-06-24 DIAGNOSIS — I82409 Acute embolism and thrombosis of unspecified deep veins of unspecified lower extremity: Secondary | ICD-10-CM

## 2012-06-24 DIAGNOSIS — IMO0001 Reserved for inherently not codable concepts without codable children: Secondary | ICD-10-CM

## 2012-06-24 DIAGNOSIS — M791 Myalgia, unspecified site: Secondary | ICD-10-CM

## 2012-06-24 DIAGNOSIS — M4800 Spinal stenosis, site unspecified: Secondary | ICD-10-CM

## 2012-06-24 LAB — CK: Total CK: 121 U/L (ref 7–232)

## 2012-06-24 NOTE — Patient Instructions (Addendum)
As a trial; replace Xarelto   15 mg twice a day with 20 mg daily.

## 2012-06-24 NOTE — Addendum Note (Signed)
Addended by: Maurice Small on: 06/24/2012 08:56 AM   Modules accepted: Orders

## 2012-06-24 NOTE — Progress Notes (Signed)
  Subjective:    Patient ID: [name], male    DOB: Oct 28, 1940, 71 y.o.   MRN: [id number]  HPI He has no bleeding dyscrasias on the Xarelto; he denies any symptoms of chest pain, shortness breath, or claudication. He does describe muscle pain in the thighs and muscles below the knees when he tries to stand. If he stands for a period time he also has  radicular pain down the left leg . He states he is able to walk a quarter of a mile without leg pain.  He believes the muscle symptoms began when the Xarelto was initiated. He does have a past history of myalgias with Crestor. He is presently on simvastatin 40 mg daily.  He is on CPAP her CPAP for sleep apnea. He had a history of paroxysmal atrial fibrillation while in the ICU; this has not recurred    Review of Systems  He specifically denies epistaxis, hemoptysis, melena, rectal bleeding. He does bleed freely if he cuts himself with his razor.  His edema has decreased significantly.  His neurosurgeon has had to defer epidural steroid injections for spinal stenosis due to the Xarelto.     Objective:   Physical Exam Gen.:  well-nourished in appearance; weight excess. Alert, appropriate and cooperative throughout exam. Eyes: No corneal or conjunctival inflammation noted. No icterus. Lungs: Normal respiratory effort; chest expands symmetrically. Lungs are clear to auscultation without rales, wheezes, or increased work of breathing. Heart: Normal rate and rhythm. Normal S1 and S2. No gallop, click, or rub. S4 w/o murmur. Abdomen: Bowel sounds normal; abdomen soft and nontender. No masses, organomegaly or hernias noted. Musculoskeletal/extremities: No clubbing, cyanosis, edema, or deformity noted.  .Tone & strength  normal.Joints normal. Nail health  good. There is tenderness to compression of both the thigh and calf muscles without a definite Homans sign. Vascular: Carotid, radial artery, dorsalis pedis and  posterior tibial pulses are full  and equal. No bruits present. Neurologic: Alert and oriented x3;but he repeated questions. Deep tendon reflexes symmetrical ; deep tendon reflexes are 0-1/2+ at the knees.          Skin: Intact without suspicious lesions or rashes. There is bronzing pigmentation over the ankles and feet Lymph: No cervical, axillary lymphadenopathy present. Psych: Mood and affect are normal. Normally interactive                                                                                         Assessment & Plan:  #1 leg pain bilaterally involving both thighs and lower legs. He is concerned this is related to his Xarelto. He has had myalgias on Crestor; he is on simvastatin at this time. 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  #2 past history of paroxysmal atrial fibrillation; clinically now in normal sinus rhythm.  #3 status post deep venous thrombosis  Plan: CK will be checked. Options include switching to warfarin; problematic is his polypharmacy. Pending labs Xarelto will be decreased to 20 mg daily to see if there is an impact on his symptoms

## 2012-08-20 ENCOUNTER — Other Ambulatory Visit: Payer: Self-pay | Admitting: Internal Medicine

## 2012-08-22 ENCOUNTER — Telehealth: Payer: Self-pay

## 2012-08-22 NOTE — Telephone Encounter (Signed)
[name] called patient, I informed patient I sent rx to [facility] [address] for that is where request came from. Patient stated that was correct to send rx to [facility] [address]

## 2012-08-22 NOTE — Telephone Encounter (Signed)
Called pt [facility] to call office and  verify pharmacy.   MW

## 2012-08-22 NOTE — Telephone Encounter (Signed)
Pt called [facility] triage line stating returning call concerning Rx and where it needs to be sent.  Plz advise    MW

## 2012-10-03 ENCOUNTER — Telehealth: Payer: Self-pay | Admitting: Internal Medicine

## 2012-10-03 ENCOUNTER — Emergency Department (HOSPITAL_COMMUNITY): Payer: Medicare Other

## 2012-10-03 ENCOUNTER — Emergency Department (HOSPITAL_COMMUNITY)
Admission: EM | Admit: 2012-10-03 | Discharge: 2012-10-04 | Disposition: A | Discharge status: Home / Self Care | Payer: Medicare Other | Attending: Emergency Medicine | Admitting: Emergency Medicine

## 2012-10-03 ENCOUNTER — Encounter (HOSPITAL_COMMUNITY): Payer: Self-pay | Admitting: Emergency Medicine

## 2012-10-03 DIAGNOSIS — Z8679 Personal history of other diseases of the circulatory system: Secondary | ICD-10-CM | POA: Insufficient documentation

## 2012-10-03 DIAGNOSIS — Z8719 Personal history of other diseases of the digestive system: Secondary | ICD-10-CM | POA: Insufficient documentation

## 2012-10-03 DIAGNOSIS — R509 Fever, unspecified: Secondary | ICD-10-CM | POA: Insufficient documentation

## 2012-10-03 DIAGNOSIS — R5381 Other malaise: Secondary | ICD-10-CM | POA: Insufficient documentation

## 2012-10-03 DIAGNOSIS — R5383 Other fatigue: Secondary | ICD-10-CM | POA: Insufficient documentation

## 2012-10-03 DIAGNOSIS — Z87448 Personal history of other diseases of urinary system: Secondary | ICD-10-CM | POA: Insufficient documentation

## 2012-10-03 DIAGNOSIS — F17200 Nicotine dependence, unspecified, uncomplicated: Secondary | ICD-10-CM | POA: Insufficient documentation

## 2012-10-03 DIAGNOSIS — E785 Hyperlipidemia, unspecified: Secondary | ICD-10-CM | POA: Insufficient documentation

## 2012-10-03 DIAGNOSIS — I10 Essential (primary) hypertension: Secondary | ICD-10-CM | POA: Insufficient documentation

## 2012-10-03 DIAGNOSIS — G4730 Sleep apnea, unspecified: Secondary | ICD-10-CM | POA: Insufficient documentation

## 2012-10-03 LAB — CBC WITH DIFFERENTIAL/PLATELET
Basophils Absolute: 0 10*3/uL (ref 0.0–0.1)
Eosinophils Absolute: 0.1 10*3/uL (ref 0.0–0.7)
Eosinophils Relative: 1 % (ref 0–5)
Lymphocytes Relative: 18 % (ref 12–46)
MCH: 32.4 pg (ref 26.0–34.0)
MCV: 93.2 fL (ref 78.0–100.0)
Platelets: 199 10*3/uL (ref 150–400)
RDW: 13 % (ref 11.5–15.5)
WBC: 10.9 10*3/uL — ABNORMAL HIGH (ref 4.0–10.5)

## 2012-10-03 LAB — URINALYSIS, ROUTINE W REFLEX MICROSCOPIC
Hgb urine dipstick: NEGATIVE
Leukocytes, UA: NEGATIVE
Protein, ur: NEGATIVE mg/dL
Urobilinogen, UA: 1 mg/dL (ref 0.0–1.0)

## 2012-10-03 LAB — GLUCOSE, CAPILLARY

## 2012-10-03 LAB — COMPREHENSIVE METABOLIC PANEL
ALT: 53 U/L (ref 0–53)
AST: 45 U/L — ABNORMAL HIGH (ref 0–37)
Calcium: 9.5 mg/dL (ref 8.4–10.5)
Sodium: 135 mEq/L (ref 135–145)
Total Protein: 7.8 g/dL (ref 6.0–8.3)

## 2012-10-03 LAB — POCT I-STAT TROPONIN I: Troponin i, poc: 0.03 ng/mL (ref 0.00–0.08)

## 2012-10-03 MED ORDER — ACETAMINOPHEN 325 MG PO TABS
650.0000 mg | ORAL_TABLET | Freq: Once | ORAL | Status: AC
  Administered 2012-10-03: 650 mg via ORAL
  Filled 2012-10-03: qty 2

## 2012-10-03 NOTE — Telephone Encounter (Signed)
Please advise 

## 2012-10-03 NOTE — ED Provider Notes (Signed)
History     CSN: [id number]  Arrival date & time 10/03/12  1635   First MD Initiated Contact with Patient 10/03/12 2149      Chief Complaint  Patient presents with  . Weakness  . Fever    (Consider location/radiation/quality/duration/timing/severity/associated sxs/prior treatment) HPI Comments: Patient with history of frontotemporal dementia presents today with complaints of fever and bilateral leg weakness for the past day. Family contacted primary care physician and was told to come to the emergency department for evaluation. No treatments prior to arrival. Patient has been noted to be having trouble getting up from a chair earlier today stating at times that he has pain in his legs at times and his legs are weak. No upper extremity symptoms. Patient denies headache or neck pain. He denies cold symptoms. Patient has had an occasional cough over the past few days it has been nonproductive. He has had some wheezing at times. No chest pain or shortness of breath. No abdominal pain, nausea, vomiting, or diarrhea. No urinary symptoms or hematuria. No skin rashes. Patient is currently on Xarelto 2/2 lower extremity DVT. No change in the appearance of the legs. Caregiver notes that this is an acute change for the patient. History of lumbar laminectomy. Patient denies back pain. Surgery was in August 2012. No urinary incontinence or retention. Onset of symptoms gradual. Course is constant. Nothing makes symptoms better or worse.  The history is provided by the patient and a caregiver.    Past Medical History  Diagnosis Date  . Hyperlipidemia     [place] Study LDL goal = < 130 if non smoker (he smokes 1 cigar/mouth)  . Hypertension   . Diverticulosis of colon   . Sleep apnea     CPAP Rxed by Dr Marylou Flesher  . Spinal stenosis   . Benign prostatic hypertrophy   . VT (ventricular tachycardia) 04/2011    post op  . Sleep apnea     Past Surgical History  Procedure Date  . Hemorrhoid surgery    . Anal fistulectomy   . Lumbar laminectomy 1975  . Colonoscopy     tics, Dr Matthias Hughs  . Tonsillectomy   . Vasectomy   . Spinal tap 07/2010    09/200 elevated csf protein; + ANA (normal cryoglobulin, ANCA, Rehmatology evaltuaion  . Neuropsych eval     ? progressive bilat subcortical white matter disease, R/O autoimmune dementia  . Lumbar laminectomy 04/2011    Complicated by postop tachycardia, profound fever and mental status changes    Family History  Problem Relation Age of Onset  . Diabetes Mother   . Hypertension Mother   . Alcohol abuse Father   . Heart failure Father   . Heart attack Father 55  . Heart disease Father   . Diabetes Sister     History  Substance Use Topics  . Smoking status: Current Some Day Smoker -- 0.0 packs/day    Types: Cigars, Cigarettes  . Smokeless tobacco: Current User     Comment: 1 cigar / month  . Alcohol Use: 4.2 oz/week    7 Glasses of wine per week     Comment: occ      Review of Systems  Constitutional: Positive for fever. Negative for activity change.  HENT: Negative for sore throat, rhinorrhea and neck stiffness.   Eyes: Negative for redness.  Respiratory: Negative for cough.   Cardiovascular: Negative for chest pain.  Gastrointestinal: Negative for nausea, vomiting, abdominal pain and diarrhea.  Genitourinary: Negative for  dysuria and hematuria.  Musculoskeletal: Negative for myalgias and back pain.  Skin: Negative for rash.  Neurological: Positive for weakness. Negative for headaches.  Psychiatric/Behavioral: Positive for confusion (baseline).  All other systems reviewed and are negative.    Allergies  Other; Rosuvastatin; Tadalafil; and Tramadol hcl  Home Medications   Current Outpatient Rx  Name  Route  Sig  Dispense  Refill  . ARMODAFINIL 150 MG PO TABS   Oral   Take 150 mg by mouth daily.         Marland Kitchen CIPROFLOXACIN-DEXAMETHASONE 0.3-0.1 % OT SUSP      4 drops 2 (two) times daily as needed.          .  ENALAPRIL MALEATE 20 MG PO TABS   Oral   Take 1 tablet (20 mg total) by mouth daily.   90 tablet   3   . OMEGA-3 FATTY ACIDS 1000 MG PO CAPS   Oral   Take 1 capsule by mouth daily.           Marland Kitchen FLAX SEED OIL 1000 MG PO CAPS   Oral   Take 1 capsule by mouth daily.           . FUROSEMIDE 40 MG PO TABS      TAKE 1 TABLET BY MOUTH EVERY DAY   30 tablet   5   . GABAPENTIN 600 MG PO TABS   Oral   Take 600 mg by mouth 3 (three) times daily.         Marland Kitchen MEMANTINE HCL 10 MG PO TABS   Oral   Take 10 mg by mouth 2 (two) times daily.           Marland Kitchen ONE-DAILY MULTI VITAMINS PO TABS   Oral   Take 1 tablet by mouth daily.           Marland Kitchen SIMVASTATIN 40 MG PO TABS   Oral   Take 1 tablet (40 mg total) by mouth at bedtime.   90 tablet   3   . XARELTO 15 MG PO TABS      TAKE 1 TABLET BY MOUTH TWICE A DAY A MEAL   42 tablet   0     It should be refilled for 3 more weeks @ 15 MG BID .Marland KitchenMarland Kitchen     BP 116/70  Pulse 80  Temp 101.5 F (38.6 C) (Oral)  Resp 18  SpO2 92%  Physical Exam  Nursing note and vitals reviewed. Constitutional: He appears well-developed and well-nourished.  HENT:  Head: Normocephalic and atraumatic.  Eyes: Conjunctivae normal are normal. Right eye exhibits no discharge. Left eye exhibits no discharge.  Neck: Normal range of motion. Neck supple.  Cardiovascular: Normal rate, regular rhythm and normal heart sounds.   Pulmonary/Chest: Effort normal and breath sounds normal. No respiratory distress. He has no wheezes. He has no rales.  Abdominal: Soft. Bowel sounds are normal. There is no tenderness. There is no rebound and no guarding.  Musculoskeletal: He exhibits no tenderness.       Venous stasis change lower extremities bilaterally. No pitting edema.  Neurological: He is alert. He has normal strength. No cranial nerve deficit or sensory deficit. He displays a negative Romberg sign. GCS eye subscore is 4. GCS verbal subscore is 5. GCS motor subscore is 6.        5/5 strength bilaterally of ankles, knees, hips.  Skin: Skin is warm and dry.  Psychiatric: He has a normal mood  and affect.    ED Course  Procedures (including critical care time)  Labs Reviewed  CBC WITH DIFFERENTIAL - Abnormal; Notable for the following:    WBC 10.9 (*)     Monocytes Relative 17 (*)     Monocytes Absolute 1.9 (*)     All other components within normal limits  COMPREHENSIVE METABOLIC PANEL - Abnormal; Notable for the following:    Glucose, Bld 120 (*)     Creatinine, Ser 1.44 (*)     AST 45 (*)     GFR calc non Af Amer 47 (*)     GFR calc Af Amer 55 (*)     All other components within normal limits  GLUCOSE, CAPILLARY - Abnormal; Notable for the following:    Glucose-Capillary 112 (*)     All other components within normal limits  POCT I-STAT TROPONIN I  URINALYSIS, ROUTINE W REFLEX MICROSCOPIC   Dg Chest 2 View  10/03/2012  *RADIOLOGY REPORT*  Clinical Data: Congestion, weakness and fever.  CHEST - 2 VIEW  Comparison: Chest radiograph performed 04/21/2011  Findings: The lungs are well-aerated.  Minimal left basilar opacity likely reflects atelectasis.  There is no evidence of pleural effusion or pneumothorax.  The heart is normal in size; the mediastinal contour is within normal limits.  No acute osseous abnormalities are seen.  IMPRESSION: Minimal left basilar opacity likely reflects atelectasis; lungs otherwise clear.   Original Report Authenticated By: Tonia Ghent, M.D.      1. Fever     9:59 PM Patient seen and examined. Work-up initiated/reviewed. Added CXR, UA.    Vital signs reviewed and are as follows: Filed Vitals:   10/03/12 2011  BP: 116/70  Pulse: 80  Temp:   Resp:   BP 116/70  Pulse 80  Temp 101.5 F (38.6 C) (Oral)  Resp 18  SpO2 92%  Pt was d/w and seen with Dr. Blinda Leatherwood. Fever improved with Tylenol. Patient has been up and ambulating in the department without difficulty. Patient states that he is feeling better.  No clear  infectious etiology found on workup tonight.  Patient to return if worsening symptoms, persistent vomiting, persistent fever.  BP 120/66  Pulse 84  Temp 99.6 F (37.6 C) (Oral)  Resp 16  SpO2 95%    MDM  Patient with fever, no clear source. He is able to ambulate at baseline in ED. Patient and caregiver are comfortable with discharge to home with primary care followup, return if worsening. No concern for stroke. Workup is reassuring. Patient appears well and wants to go home.        Renne Crigler, [place] 10/04/12 [phone]

## 2012-10-03 NOTE — Telephone Encounter (Signed)
Agree w/ ER evaluation

## 2012-10-03 NOTE — Telephone Encounter (Signed)
Patient Information:  Caller Name: Sharyl Nimrod  Phone: [phone]  Patient: Samara Snide  Gender: Male  DOB: June 02, 1941  Age: 71 Years  PCP: Marga Melnick  Office Follow Up:  Does the office need to follow up with this patient?: No  Instructions For The Office: N/A  RN Note:  This is new for him.  States that his h/a is worse today but denies any other sx.  Symptoms  Reason For Call & Symptoms: Wife calling, has had a tickle in his throat since Friday 12/27 with a fever developing this am 101 orally.  He has increased weakness and has to try multiple trys (at least 10) to get from a sitting position to standing from the couch.  Reviewed Health History In EMR: Yes  Reviewed Medications In EMR: Yes  Reviewed Allergies In EMR: Yes  Reviewed Surgeries / Procedures: Yes  Date of Onset of Symptoms: 10/03/2012  Any Fever: Yes  Fever Taken: Oral  Fever Time Of Reading: 15:32:10  Fever Last Reading: 101  Guideline(s) Used:  Weakness (Generalized) and Fatigue  Disposition Per Guideline:   Go to Office Now  Reason For Disposition Reached:   Moderate weakness (i.e., interferes with work, school, normal activities) and cause unknown  Advice Given:  N/A  Sent to [facility] ED for evaluation.

## 2012-10-03 NOTE — ED Notes (Addendum)
Pt with c/o lower extremity weakness bilaterally onset 2 days and fever onset today. Reports lightheaded. Pt also c/o back pain

## 2012-10-03 NOTE — ED Notes (Signed)
Pt ambulated in hall with EMT. Steady gait. Denied any pain in legs with walking. Tolerated well.

## 2012-10-07 ENCOUNTER — Ambulatory Visit (INDEPENDENT_AMBULATORY_CARE_PROVIDER_SITE_OTHER): Payer: Medicare Other | Admitting: Internal Medicine

## 2012-10-07 ENCOUNTER — Encounter: Payer: Self-pay | Admitting: Internal Medicine

## 2012-10-07 VITALS — BP 128/74 | HR 78 | Temp 97.8°F | Wt 307.0 lb

## 2012-10-07 DIAGNOSIS — J9811 Atelectasis: Secondary | ICD-10-CM

## 2012-10-07 DIAGNOSIS — J9819 Other pulmonary collapse: Secondary | ICD-10-CM

## 2012-10-07 DIAGNOSIS — J45909 Unspecified asthma, uncomplicated: Secondary | ICD-10-CM

## 2012-10-07 MED ORDER — FLUTICASONE-SALMETEROL 250-50 MCG/DOSE IN AEPB: 1.0000 | INHALATION_SPRAY | Freq: Two times a day (BID) | RESPIRATORY_TRACT | Status: AC

## 2012-10-07 MED ORDER — AZITHROMYCIN 250 MG PO TABS: ORAL_TABLET | ORAL | Status: DC

## 2012-10-07 NOTE — ED Provider Notes (Signed)
Medical screening examination/treatment/procedure(s) were conducted as a shared visit with non-physician practitioner(s) and myself.  I personally evaluated the patient during the encounter.  Patient seen during the period of evaluation and labs were reviewed. Agree with his treatment plan. Patient did improve during the ER stay and was able to be discharged.  Gilda Crease, MD 10/07/12 2103

## 2012-10-07 NOTE — Progress Notes (Signed)
  Subjective:    Patient ID: [name], male    DOB: 1940-10-30, 72 y.o.   MRN: [id number]  HPI ER notes 12/30-31 reviewed. He was seen for bilateral leg weakness, fever, and wheezing. Chest x-ray suggests some atelectasis at the left base.  White count was minimally elevated at 10,900. Differential revealed 17% monocytes. Troponin was negative.  Since emergent visit; he denies fever or progression of leg symptoms. He continues to have some wheezing and cough with white sputum. He states he feels that he had the "flu"   Review of Systems Leg symptoms have actually been present since August of 2012 but had worsened during the period 12/27-30.   Significant associated symptoms includes frontal headache w/o facial pain, dental pain, sore throat, nasal purulence, earache, & otic discharge   Itchy , watery eyes & sneezing were not noted.  Treatment with  Mucinex was partially effective  There is no history of asthma. The patient smokes 1 cigar / month                   Objective:   Physical Exam General appearance:good health ;well nourished; no acute distress or increased work of breathing is present.  No  lymphadenopathy about the head, neck, or axilla noted.   Eyes: No conjunctival inflammation or lid edema is present. There is no scleral icterus.  Ears:  External ear exam shows no significant lesions or deformities.  Otoscopic examination reveals clear canals, tympanic membranes are intact bilaterally without bulging, retraction, inflammation or discharge.  Nose:  External nasal examination shows no deformity or inflammation. Nasal mucosa are pink and moist without lesions or exudates. No septal dislocation or deviation.No obstruction to airflow.   Oral exam: Dental hygiene is good; lips and gums are healthy appearing.There is no oropharyngeal erythema or exudate noted.   Neck:  No deformities,  masses, or tenderness noted.   Supple with full range of motion without  pain.   Heart:  Normal rate and regular rhythm. S1 and S2 normal without gallop, murmur, click, rub or other extra sounds.   Lungs: Very low-grade asymmetric wheezing greater on the right.No increased work of breathing.    Extremities:  No cyanosis, edema, or clubbing  noted . Straight leg raising is negative bilaterally with no signs of meningismus. Strength and tone in the lower extremities is excellent   Skin: Warm & dry .          Assessment & Plan:  #1 asthmatic bronchitis with mild atelectasis  Plan: See orders and recommendations

## 2012-10-07 NOTE — Patient Instructions (Addendum)
Advair one inhalation every 12 hours; gargle and spit after use. Please  blowup at least 10  balloons a day to enhance inflation of the lungs and prevent atelectasis as we discussed.

## 2012-10-18 ENCOUNTER — Telehealth: Payer: Self-pay | Admitting: *Deleted

## 2012-10-18 ENCOUNTER — Other Ambulatory Visit: Payer: Self-pay | Admitting: Internal Medicine

## 2012-10-18 DIAGNOSIS — I82409 Acute embolism and thrombosis of unspecified deep veins of unspecified lower extremity: Secondary | ICD-10-CM

## 2012-10-18 NOTE — Telephone Encounter (Signed)
Pt would like to know if he needs to have a U/S of leg to make sure that the clots are gone since he is on med for this.Please advise

## 2012-10-25 ENCOUNTER — Encounter (INDEPENDENT_AMBULATORY_CARE_PROVIDER_SITE_OTHER): Payer: Medicare Other

## 2012-10-25 DIAGNOSIS — I87009 Postthrombotic syndrome without complications of unspecified extremity: Secondary | ICD-10-CM

## 2012-10-25 DIAGNOSIS — Z86718 Personal history of other venous thrombosis and embolism: Secondary | ICD-10-CM

## 2012-10-25 DIAGNOSIS — I82409 Acute embolism and thrombosis of unspecified deep veins of unspecified lower extremity: Secondary | ICD-10-CM

## 2012-10-25 DIAGNOSIS — M79609 Pain in unspecified limb: Secondary | ICD-10-CM

## 2012-10-28 ENCOUNTER — Encounter: Payer: Self-pay | Admitting: Internal Medicine

## 2012-10-31 ENCOUNTER — Other Ambulatory Visit: Payer: Self-pay | Admitting: Internal Medicine

## 2012-10-31 DIAGNOSIS — I82409 Acute embolism and thrombosis of unspecified deep veins of unspecified lower extremity: Secondary | ICD-10-CM

## 2012-11-23 ENCOUNTER — Encounter: Payer: Self-pay | Admitting: Vascular Surgery

## 2012-11-24 ENCOUNTER — Encounter: Payer: Self-pay | Admitting: Vascular Surgery

## 2012-11-24 ENCOUNTER — Ambulatory Visit (INDEPENDENT_AMBULATORY_CARE_PROVIDER_SITE_OTHER): Payer: Medicare Other | Admitting: Vascular Surgery

## 2012-11-24 VITALS — BP 113/72 | HR 80 | Ht 74.0 in | Wt 308.2 lb

## 2012-11-24 DIAGNOSIS — I82409 Acute embolism and thrombosis of unspecified deep veins of unspecified lower extremity: Secondary | ICD-10-CM

## 2012-11-24 NOTE — Progress Notes (Signed)
[facility] [place] HISTORY AND PHYSICAL   History of Present Illness:  Patient is a 72 y.o. year old male who presents for evaluation of continued need for evaluation of anticoagulation for prior DVT.  The patient developed bilateral leg swelling in June of 2013. Evaluation of this showed bilateral DVT. He was started on Xarelto in August of 2013. He denies any recurrent leg swelling. He denies any family history of hypercoagulable state. He denies any prior DVT events. He has had no trauma to the lower extremities recently. He overall is reasonably active although failure fairly sedentary. This is primarily due to his frontal lobe dementia. His wife states that he may have had one episode of thrombophlebitis in the remote past. He currently smokes 1 cigar per month. He is not extended in quitting. His cardiologist is Dr. Eden Emms. Other medical problems include frontal lobe dementia, hyperlipidemia, hypertension, sleep apnea, spinal stenosis all of which are currently stable.  He had a venous duplex exam at [facility] 10/25/2012 which I reviewed today. 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    Past Medical History  Diagnosis Date  . Hyperlipidemia     [place] Study LDL goal = < 130 if non smoker (he smokes 1 cigar/mouth)  . Hypertension   . Diverticulosis of colon   . Sleep apnea     CPAP Rxed by Dr Marylou Flesher  . Spinal stenosis   . Benign prostatic hypertrophy   . VT (ventricular tachycardia) 04/2011    post op  . Sleep apnea   . DVT (deep venous thrombosis)   . Frontotemporal dementia     Past Surgical History  Procedure Laterality Date  . Hemorrhoid surgery    . Anal fistulectomy    . Lumbar laminectomy  1975  . Colonoscopy      tics, Dr Matthias Hughs  . Tonsillectomy    . Vasectomy    . Spinal tap  07/2010    09/200 elevated csf protein; + ANA (normal cryoglobulin, ANCA, Rehmatology evaltuaion  . Neuropsych eval      ?  progressive bilat subcortical white matter disease, R/O autoimmune dementia  . Lumbar laminectomy  04/2011    Complicated by postop tachycardia, profound fever and mental status changes     Social History History  Substance Use Topics  . Smoking status: Current Some Day Smoker -- 0.00 packs/day    Types: Cigars  . Smokeless tobacco: Never Used     Comment: 1 cigar / month  . Alcohol Use: 4.8 oz/week    7 Glasses of wine, 1 Shots of liquor per week     Comment: occ    Family History Family History  Problem Relation Age of Onset  . Diabetes Mother   . Hypertension Mother   . Heart disease Mother   . Hyperlipidemia Mother   . Alcohol abuse Father   . Heart failure Father   . Heart attack Father 66  . Heart disease Father   . Hyperlipidemia Father   . Hypertension Father   . Diabetes Sister   . Hyperlipidemia Sister   . Hypertension Sister     Allergies  Allergies  Allergen Reactions  . Other Other (See Comments)    Anesthetics during lumbar laminectomy surgery - blood pressure increased quickly   . Rosuvastatin Other (See Comments)     myalgias  . Tadalafil Other (See Comments)     headache, elevated blood pressure  . Tramadol Hcl  Other (See Comments)     Didnt help Headaches and patient gets dopey on med     Current Outpatient Prescriptions  Medication Sig Dispense Refill  . enalapril (VASOTEC) 20 MG tablet Take 1 tablet (20 mg total) by mouth daily.  90 tablet  3  . fish oil-omega-3 fatty acids 1000 MG capsule Take 1 g by mouth daily.       . Flaxseed, Linseed, (FLAX SEED OIL) 1000 MG CAPS Take 1,000 mg by mouth daily.       . furosemide (LASIX) 40 MG tablet Take 40 mg by mouth daily.      Marland Kitchen gabapentin (NEURONTIN) 300 MG capsule Take 600 mg by mouth 3 (three) times daily.       . memantine (NAMENDA) 10 MG tablet Take 10 mg by mouth 2 (two) times daily.       . modafinil (PROVIGIL) 200 MG tablet Take 200 mg by mouth every morning.       . Multiple Vitamin  (MULTIVITAMIN WITH MINERALS) TABS Take 1 tablet by mouth daily.      . Rivaroxaban (XARELTO) 20 MG TABS Take 20 mg by mouth every morning.       . simvastatin (ZOCOR) 40 MG tablet Take 1 tablet (40 mg total) by mouth at bedtime.  90 tablet  3  . tiZANidine (ZANAFLEX) 4 MG tablet Take 1 tablet by mouth.      Marland Kitchen azithromycin (ZITHROMAX Z-PAK) 250 MG tablet 2 day 1, then 1 qd  6 each  0  . Fluticasone-Salmeterol (ADVAIR DISKUS) 250-50 MCG/DOSE AEPB Inhale 1 puff into the lungs 2 (two) times daily.  14 each  0   No current facility-administered medications for this visit.    ROS:   General:  No weight loss, Fever, chills  HEENT: + recent headaches chronic, no nasal bleeding, no visual changes, no sore throat  Neurologic: No dizziness, blackouts, seizures. No recent symptoms of stroke or mini- stroke. No recent episodes of slurred speech, or temporary blindness.  Cardiac: No recent episodes of chest pain/pressure, no shortness of breath at rest.  + shortness of breath with exertion.  + history of atrial fibrillation now resolved  Vascular: No history of rest pain in feet.  No history of claudication.  No history of non-healing ulcer, + history of DVT   Pulmonary: No home oxygen, no productive cough, no hemoptysis,  No asthma or wheezing  Musculoskeletal:  [ ]  Arthritis, [x ] Low back pain,  [ ]  Joint pain  Hematologic:No history of hypercoagulable state.  No history of easy bleeding.  No history of anemia  Gastrointestinal: No hematochezia or melena,  No gastroesophageal reflux, no trouble swallowing  Urinary: [ ]  chronic Kidney disease, [ ]  on HD - [ ]  MWF or [ ]  TTHS, [ ]  Burning with urination, [ ]  Frequent urination, [ ]  Difficulty urinating;   Skin: No rashes  Psychological: No history of anxiety,  No history of depression   Physical Examination  Filed Vitals:   11/24/12 0901  BP: 113/72  Pulse: 80  Height: 6\' 2"  (1.88 m)  Weight: 308 lb 3.2 oz (139.799 kg)  SpO2: 94%     Body mass index is 39.55 kg/(m^2).  General:  Alert and oriented, no acute distress HEENT: Normal Neck: No bruit or JVD Pulmonary: Clear to auscultation bilaterally Cardiac: Regular Rate and Rhythm without murmur Abdomen: Soft, non-tender, non-distended, no mass, obese Skin: No rash Extremity Pulses:  2+ radial, brachial, femoral, dorsalis pedis,  posterior tibial pulses bilaterally, congested bluish appearance both feet Musculoskeletal: No deformity or edema  Neurologic: Upper and lower extremity motor 5/5 and symmetric   ASSESSMENT: Patient with history of DVT who has now been on chronic anticoagulation therapy for 6 months time. He has no evidence of recurrent DVT. He did have a recent duplex ultrasound which a deficit some chronic DVT. The risk of embolization of this is extremely small. This is most likely fibrous and incorporated at this point. I believe at this point the risk of anticoagulation probably is higher than the risk of recurrent DVT or pulmonary embolus. He really has no significant risk factors for DVT at this point.  Plan: I believe it is okay to stop his Xarelto at this point. He will followup on as-needed basis. If he develops recurrent DVT or any signs and symptoms of pulmonary embolus at some point in the future he would probably require lifelong anticoagulation. However, his risk profile is fairly low for this. He was given a perception today for bilateral lower extremity compression stockings and I told him he should wear these on a daily basis.   Fabienne Bruns, MD [facility] [place] Office: [phone] Pager: [phone]

## 2012-12-02 ENCOUNTER — Ambulatory Visit (INDEPENDENT_AMBULATORY_CARE_PROVIDER_SITE_OTHER): Payer: Medicare Other | Admitting: Internal Medicine

## 2012-12-02 ENCOUNTER — Encounter: Payer: Self-pay | Admitting: Internal Medicine

## 2012-12-02 VITALS — BP 124/78 | HR 76 | Resp 12 | Ht 74.0 in | Wt 310.0 lb

## 2012-12-02 DIAGNOSIS — K5730 Diverticulosis of large intestine without perforation or abscess without bleeding: Secondary | ICD-10-CM

## 2012-12-02 DIAGNOSIS — E785 Hyperlipidemia, unspecified: Secondary | ICD-10-CM

## 2012-12-02 DIAGNOSIS — Z0000 Encounter for general adult medical examination without abnormal findings: Secondary | ICD-10-CM

## 2012-12-02 DIAGNOSIS — I10 Essential (primary) hypertension: Secondary | ICD-10-CM

## 2012-12-02 LAB — CBC WITH DIFFERENTIAL/PLATELET
Basophils Absolute: 0 10*3/uL (ref 0.0–0.1)
Hemoglobin: 15 g/dL (ref 13.0–17.0)
Lymphocytes Relative: 21.2 % (ref 12.0–46.0)
Monocytes Relative: 9.8 % (ref 3.0–12.0)
Neutro Abs: 6.7 10*3/uL (ref 1.4–7.7)
Neutrophils Relative %: 65.5 % (ref 43.0–77.0)
RDW: 13.1 % (ref 11.5–14.6)

## 2012-12-02 LAB — BASIC METABOLIC PANEL
Calcium: 9.4 mg/dL (ref 8.4–10.5)
Creatinine, Ser: 1.2 mg/dL (ref 0.4–1.5)
GFR: 65.08 mL/min (ref >60.00)
Glucose, Bld: 120 mg/dL — ABNORMAL HIGH (ref 70–99)
Sodium: 137 mEq/L (ref 135–145)

## 2012-12-02 LAB — HEPATIC FUNCTION PANEL
AST: 36 U/L (ref 0–37)
Albumin: 4.1 g/dL (ref 3.5–5.2)
Alkaline Phosphatase: 55 U/L (ref 39–117)
Bilirubin, Direct: 0.1 mg/dL (ref 0.0–0.3)
Total Bilirubin: 0.7 mg/dL (ref 0.3–1.2)

## 2012-12-02 LAB — TSH: TSH: 2.39 u[IU]/mL (ref 0.35–5.50)

## 2012-12-02 LAB — LIPID PANEL
HDL: 28.9 mg/dL — ABNORMAL LOW (ref >39.00)
LDL Cholesterol: 82 mg/dL (ref 0–99)
Total CHOL/HDL Ratio: 5
VLDL: 32.4 mg/dL (ref 0.0–40.0)

## 2012-12-02 NOTE — Progress Notes (Signed)
Subjective:    Patient ID: [name], male    DOB: 1941/05/25, 72 y.o.   MRN: [id number]  HPI Medicare Wellness Visit:  Psychosocial & medical history were reviewed as required by Medicare (abuse,antisocial behavioral risks,firearm risk).  Social history: caffeine:23 cups coffee/ day  , alcohol: 2 glasses wine / night ,  tobacco use: 1 cigar /month Exercise :  Walking 1/2 mpd; limited due to leg pain from radiculopathy & dyspnea No home & personal  safety / fall risk Activities of daily living: no limitations  Seatbelt  and smoke alarm employed. Power of Attorney/Living Will status : in place Ophthalmology exam not  current Hearing evaluation not current Orientation :oriented X 3  Memory & recall :2 of 3 recalled Math testing:excellent Mood & affect : normal . Depression / anxiety: denied Travel history : last 2003 [place]  Immunization status :Shingles needed Transfusion history:  none  Preventive health surveillance ( colonoscopy as per protocol/ [facility]): not current Dental care:  Every 6 mos. Chart reviewed &  Updated. Active issues reviewed & addressed.     Review of Systems HYPERTENSION: Disease Monitoring: Blood pressure range-not monitored @ home; 120/80 -130/80 @ MD appts  Chest pain, palpitations- no       Dyspnea- he nies unless climbing incline Medications: Compliance-yes Lightheadedness,Syncope-no    Edema-not now   HYPERLIPIDEMIA: Disease Monitoring: See symptoms for Hypertension Medications: Compliance- yes  Abd pain, bowel changes- no ; colonoscopy recall notice received  Muscle aches- only stiffness        Objective:   Physical Exam Gen.: Well-nourished in appearance. Alert, appropriate and cooperative throughout exam. Head: Normocephalic without obvious abnormalities Eyes: No corneal or conjunctival inflammation noted. Ptosis bilaterally. Fundal exam is benign without hemorrhages, exudate, papilledema. Extraocular motion intact. Vision  grossly normal with lenses Ears: External  ear exam reveals no significant lesions or deformities. Canals clear .TMs normal. Hearing is grossly normal bilaterally. Nose: External nasal exam reveals no deformity or inflammation. Nasal mucosa are pink and moist. No lesions or exudates noted.   Mouth: Oral mucosa and oropharynx reveal no lesions or exudates. Teeth in good repair. Neck: No deformities, masses, or tenderness noted. Range of motion & Thyroid normal. Lungs: Normal respiratory effort; chest expands symmetrically. Lungs are clear to auscultation without rales, wheezes, or increased work of breathing. Heart: Normal rate and rhythm. Normal S1 and S2. No gallop, click, or rub. Grade 1/2 over 6 systolic murmur. Abdomen: Bowel sounds normal; abdomen soft and nontender. No masses, organomegaly or hernias noted.Protuberant Genitalia: Genitalia normal except for minor left varices. Prostate is normal without enlargement, asymmetry, nodularity, or induration.                         Musculoskeletal/extremities: There is some asymmetry of the posterior thoracic musculature suggesting occult scoliosis. No clubbing, cyanosis, edema, or significant extremity  deformity noted. Range of motion normal .Tone & strength  Normal. Joints reveal mild  PIP changes. Nail health good. Able to lie down & sit up w/o help. Negative SLR bilaterally Vascular: Carotid, radial artery, dorsalis pedis and  posterior tibial pulses are full and equal. No bruits present. Neurologic: Alert and oriented x3. Deep tendon reflexes symmetrical and 1/2+ @ knees.  Skin: Intact without suspicious lesions or rashes. Lymph: No cervical, axillary, or inguinal lymphadenopathy present. Psych: Mood and affect are normal. Normally interactive  Assessment & Plan:  Medicare Wellness Exam; criteria met ; data entered #2 Problem List reviewed ;  Assessment/ Recommendations made Plan: see Orders

## 2012-12-02 NOTE — Patient Instructions (Addendum)
Preventive Health Care: Exercise at least 30-45 minutes a day,  3-4 days a week.  Eat a low-fat diet with lots of fruits and vegetables, up to 7-9 servings per day. This would eliminate the need for vitamin supplements. Avoid obesity; your goal is waist measurement < 40 inches.Consume less than 40 grams of sugar (preferably ZERO) per day from foods & drinks with High Fructose Corn Sugar as #1,2,3 or # 4 on label. Please verify when follow up colonoscopy is due ; call Dr Matthias Hughs. Eye Doctor - have an eye exam @ least annually.                                                         Alcohol If you drink, do it moderately,less than 9 drinks per week, preferably less than 6 @ most. Review and correct the record as indicated. Please share record with all medical staff seen.

## 2012-12-06 ENCOUNTER — Ambulatory Visit: Payer: Medicare Other

## 2012-12-06 LAB — HEMOGLOBIN A1C: Hgb A1c MFr Bld: 6.5 % (ref 4.6–6.5)

## 2012-12-14 ENCOUNTER — Other Ambulatory Visit: Payer: Self-pay

## 2012-12-14 ENCOUNTER — Encounter: Payer: Self-pay | Admitting: Internal Medicine

## 2012-12-14 MED ORDER — ENALAPRIL MALEATE 20 MG PO TABS: 20.0000 mg | ORAL_TABLET | Freq: Every day | ORAL | Status: AC

## 2012-12-14 MED ORDER — FUROSEMIDE 40 MG PO TABS: 40.0000 mg | ORAL_TABLET | Freq: Every day | ORAL | Status: AC

## 2012-12-14 MED ORDER — SIMVASTATIN 40 MG PO TABS: 40.0000 mg | ORAL_TABLET | Freq: Every day | ORAL | Status: AC

## 2013-08-10 ENCOUNTER — Other Ambulatory Visit: Payer: Self-pay

## 2013-12-08 ENCOUNTER — Other Ambulatory Visit: Payer: Self-pay | Admitting: Neurosurgery

## 2013-12-08 ENCOUNTER — Encounter: Payer: Medicare Other | Admitting: Internal Medicine

## 2013-12-08 DIAGNOSIS — M5416 Radiculopathy, lumbar region: Secondary | ICD-10-CM

## 2013-12-21 ENCOUNTER — Ambulatory Visit
Admission: RE | Admit: 2013-12-21 | Discharge: 2013-12-21 | Disposition: A | Discharge status: Home / Self Care | Payer: Medicare Other | Source: Ambulatory Visit | Attending: Neurosurgery | Admitting: Neurosurgery

## 2013-12-21 DIAGNOSIS — M5416 Radiculopathy, lumbar region: Secondary | ICD-10-CM

## 2013-12-21 MED ORDER — GADOBENATE DIMEGLUMINE 529 MG/ML IV SOLN
20.0000 mL | Freq: Once | INTRAVENOUS | Status: AC | PRN
  Administered 2013-12-21: 20 mL via INTRAVENOUS

## 2015-01-25 ENCOUNTER — Encounter: Payer: Self-pay | Admitting: Neurology

## 2015-04-01 ENCOUNTER — Other Ambulatory Visit: Payer: Self-pay

## 2015-04-25 ENCOUNTER — Emergency Department (HOSPITAL_COMMUNITY): Payer: Medicare Other

## 2015-04-25 ENCOUNTER — Inpatient Hospital Stay (HOSPITAL_COMMUNITY)
Admission: EM | Admit: 2015-04-25 | Discharge: 2015-05-06 | DRG: 917 | Disposition: E | Payer: Medicare Other | Attending: Pulmonary Disease | Admitting: Pulmonary Disease

## 2015-04-25 ENCOUNTER — Encounter (HOSPITAL_COMMUNITY): Payer: Self-pay | Admitting: *Deleted

## 2015-04-25 DIAGNOSIS — F17210 Nicotine dependence, cigarettes, uncomplicated: Secondary | ICD-10-CM | POA: Diagnosis present

## 2015-04-25 DIAGNOSIS — F0280 Dementia in other diseases classified elsewhere without behavioral disturbance: Secondary | ICD-10-CM | POA: Diagnosis present

## 2015-04-25 DIAGNOSIS — R001 Bradycardia, unspecified: Secondary | ICD-10-CM | POA: Diagnosis not present

## 2015-04-25 DIAGNOSIS — E872 Acidosis, unspecified: Secondary | ICD-10-CM | POA: Insufficient documentation

## 2015-04-25 DIAGNOSIS — Z888 Allergy status to other drugs, medicaments and biological substances status: Secondary | ICD-10-CM

## 2015-04-25 DIAGNOSIS — K6289 Other specified diseases of anus and rectum: Secondary | ICD-10-CM

## 2015-04-25 DIAGNOSIS — Z79899 Other long term (current) drug therapy: Secondary | ICD-10-CM

## 2015-04-25 DIAGNOSIS — N179 Acute kidney failure, unspecified: Secondary | ICD-10-CM | POA: Insufficient documentation

## 2015-04-25 DIAGNOSIS — Z86718 Personal history of other venous thrombosis and embolism: Secondary | ICD-10-CM | POA: Diagnosis not present

## 2015-04-25 DIAGNOSIS — Z66 Do not resuscitate: Secondary | ICD-10-CM | POA: Diagnosis present

## 2015-04-25 DIAGNOSIS — R4182 Altered mental status, unspecified: Secondary | ICD-10-CM | POA: Diagnosis present

## 2015-04-25 DIAGNOSIS — G4733 Obstructive sleep apnea (adult) (pediatric): Secondary | ICD-10-CM | POA: Diagnosis present

## 2015-04-25 DIAGNOSIS — Z978 Presence of other specified devices: Secondary | ICD-10-CM

## 2015-04-25 DIAGNOSIS — T383X5A Adverse effect of insulin and oral hypoglycemic [antidiabetic] drugs, initial encounter: Secondary | ICD-10-CM | POA: Diagnosis present

## 2015-04-25 DIAGNOSIS — R680 Hypothermia, not associated with low environmental temperature: Secondary | ICD-10-CM | POA: Diagnosis present

## 2015-04-25 DIAGNOSIS — G3109 Other frontotemporal dementia: Secondary | ICD-10-CM | POA: Diagnosis present

## 2015-04-25 DIAGNOSIS — N400 Enlarged prostate without lower urinary tract symptoms: Secondary | ICD-10-CM | POA: Diagnosis present

## 2015-04-25 DIAGNOSIS — G9341 Metabolic encephalopathy: Secondary | ICD-10-CM | POA: Diagnosis present

## 2015-04-25 DIAGNOSIS — I10 Essential (primary) hypertension: Secondary | ICD-10-CM | POA: Diagnosis present

## 2015-04-25 DIAGNOSIS — E785 Hyperlipidemia, unspecified: Secondary | ICD-10-CM | POA: Diagnosis present

## 2015-04-25 DIAGNOSIS — E875 Hyperkalemia: Secondary | ICD-10-CM | POA: Diagnosis present

## 2015-04-25 DIAGNOSIS — R404 Transient alteration of awareness: Secondary | ICD-10-CM | POA: Diagnosis not present

## 2015-04-25 DIAGNOSIS — I471 Supraventricular tachycardia: Secondary | ICD-10-CM | POA: Diagnosis not present

## 2015-04-25 DIAGNOSIS — R40243 Glasgow coma scale score 3-8, unspecified time: Secondary | ICD-10-CM | POA: Insufficient documentation

## 2015-04-25 DIAGNOSIS — Z6839 Body mass index (BMI) 39.0-39.9, adult: Secondary | ICD-10-CM

## 2015-04-25 DIAGNOSIS — I959 Hypotension, unspecified: Secondary | ICD-10-CM | POA: Diagnosis present

## 2015-04-25 DIAGNOSIS — L8990 Pressure ulcer of unspecified site, unspecified stage: Secondary | ICD-10-CM | POA: Insufficient documentation

## 2015-04-25 DIAGNOSIS — J9601 Acute respiratory failure with hypoxia: Secondary | ICD-10-CM | POA: Insufficient documentation

## 2015-04-25 DIAGNOSIS — G9340 Encephalopathy, unspecified: Secondary | ICD-10-CM | POA: Diagnosis present

## 2015-04-25 DIAGNOSIS — E119 Type 2 diabetes mellitus without complications: Secondary | ICD-10-CM | POA: Diagnosis present

## 2015-04-25 DIAGNOSIS — K559 Vascular disorder of intestine, unspecified: Secondary | ICD-10-CM

## 2015-04-25 LAB — COMPREHENSIVE METABOLIC PANEL
ALT: 47 U/L (ref 17–63)
ANION GAP: 24 — AB (ref 5–15)
AST: 43 U/L — AB (ref 15–41)
Albumin: 4.2 g/dL (ref 3.5–5.0)
Alkaline Phosphatase: 49 U/L (ref 38–126)
BUN: 18 mg/dL (ref 6–20)
CO2: 11 mmol/L — AB (ref 22–32)
CREATININE: 1.43 mg/dL — AB (ref 0.61–1.24)
Calcium: 9.3 mg/dL (ref 8.9–10.3)
Chloride: 106 mmol/L (ref 101–111)
GFR calc non Af Amer: 47 mL/min — ABNORMAL LOW (ref >60)
GFR, EST AFRICAN AMERICAN: 54 mL/min — AB (ref >60)
Glucose, Bld: 221 mg/dL — ABNORMAL HIGH (ref 65–99)
POTASSIUM: 4.9 mmol/L (ref 3.5–5.1)
Sodium: 141 mmol/L (ref 135–145)
Total Bilirubin: 0.7 mg/dL (ref 0.3–1.2)
Total Protein: 7.2 g/dL (ref 6.5–8.1)

## 2015-04-25 LAB — I-STAT ARTERIAL BLOOD GAS, ED
ACID-BASE DEFICIT: 20 mmol/L — AB (ref 0.0–2.0)
Bicarbonate: 9.7 mEq/L — ABNORMAL LOW (ref 20.0–24.0)
O2 Saturation: 100 %
PH ART: 7.082 — AB (ref 7.350–7.450)
Patient temperature: 35.4
TCO2: 11 mmol/L (ref 0–100)
pCO2 arterial: 31.9 mmHg — ABNORMAL LOW (ref 35.0–45.0)
pO2, Arterial: 287 mmHg — ABNORMAL HIGH (ref 80.0–100.0)

## 2015-04-25 LAB — I-STAT CHEM 8, ED
BUN: 24 mg/dL — AB (ref 6–20)
CREATININE: 1.4 mg/dL — AB (ref 0.61–1.24)
Calcium, Ion: 1.16 mmol/L (ref 1.13–1.30)
Chloride: 107 mmol/L (ref 101–111)
Glucose, Bld: 218 mg/dL — ABNORMAL HIGH (ref 65–99)
HCT: 54 % — ABNORMAL HIGH (ref 39.0–52.0)
HEMOGLOBIN: 18.4 g/dL — AB (ref 13.0–17.0)
Potassium: 4.8 mmol/L (ref 3.5–5.1)
Sodium: 144 mmol/L (ref 135–145)
TCO2: 13 mmol/L (ref 0–100)

## 2015-04-25 LAB — URINALYSIS, ROUTINE W REFLEX MICROSCOPIC
Bilirubin Urine: NEGATIVE
Glucose, UA: NEGATIVE mg/dL
HGB URINE DIPSTICK: NEGATIVE
KETONES UR: NEGATIVE mg/dL
Leukocytes, UA: NEGATIVE
NITRITE: NEGATIVE
Protein, ur: NEGATIVE mg/dL
Specific Gravity, Urine: 1.013 (ref 1.005–1.030)
Urobilinogen, UA: 0.2 mg/dL (ref 0.0–1.0)
pH: 6 (ref 5.0–8.0)

## 2015-04-25 LAB — CBC WITH DIFFERENTIAL/PLATELET
Basophils Absolute: 0.1 10*3/uL (ref 0.0–0.1)
Basophils Relative: 1 % (ref 0–1)
Eosinophils Absolute: 0.2 10*3/uL (ref 0.0–0.7)
Eosinophils Relative: 1 % (ref 0–5)
HEMATOCRIT: 50.6 % (ref 39.0–52.0)
Hemoglobin: 17.1 g/dL — ABNORMAL HIGH (ref 13.0–17.0)
LYMPHS ABS: 3.3 10*3/uL (ref 0.7–4.0)
Lymphocytes Relative: 26 % (ref 12–46)
MCH: 32.6 pg (ref 26.0–34.0)
MCHC: 33.8 g/dL (ref 30.0–36.0)
MCV: 96.4 fL (ref 78.0–100.0)
Monocytes Absolute: 1 10*3/uL (ref 0.1–1.0)
Monocytes Relative: 8 % (ref 3–12)
NEUTROS PCT: 64 % (ref 43–77)
Neutro Abs: 8.1 10*3/uL — ABNORMAL HIGH (ref 1.7–7.7)
Platelets: 200 10*3/uL (ref 150–400)
RBC: 5.25 MIL/uL (ref 4.22–5.81)
RDW: 13.3 % (ref 11.5–15.5)
WBC: 12.6 10*3/uL — ABNORMAL HIGH (ref 4.0–10.5)

## 2015-04-25 LAB — I-STAT TROPONIN, ED: Troponin i, poc: 0.01 ng/mL (ref 0.00–0.08)

## 2015-04-25 LAB — RAPID URINE DRUG SCREEN, HOSP PERFORMED
Amphetamines: NOT DETECTED
BENZODIAZEPINES: NOT DETECTED
Barbiturates: NOT DETECTED
Cocaine: NOT DETECTED
OPIATES: NOT DETECTED
Tetrahydrocannabinol: NOT DETECTED

## 2015-04-25 LAB — SALICYLATE LEVEL: Salicylate Lvl: <4 mg/dL (ref 2.8–30.0)

## 2015-04-25 LAB — PROTIME-INR
INR: 1.08 (ref 0.00–1.49)
PROTHROMBIN TIME: 14.2 s (ref 11.6–15.2)

## 2015-04-25 LAB — I-STAT CG4 LACTIC ACID, ED: Lactic Acid, Venous: 7.92 mmol/L (ref 0.5–2.0)

## 2015-04-25 LAB — ETHANOL: Alcohol, Ethyl (B): <5 mg/dL (ref <5)

## 2015-04-25 LAB — ACETAMINOPHEN LEVEL: Acetaminophen (Tylenol), Serum: <10 ug/mL — ABNORMAL LOW (ref 10–30)

## 2015-04-25 LAB — TSH: TSH: 9.929 u[IU]/mL — AB (ref 0.350–4.500)

## 2015-04-25 MED ORDER — SODIUM CHLORIDE 0.9 % IV BOLUS (SEPSIS)
1000.0000 mL | Freq: Once | INTRAVENOUS | Status: AC
  Administered 2015-04-25: 1000 mL via INTRAVENOUS

## 2015-04-25 MED ORDER — ROCURONIUM BROMIDE 50 MG/5ML IV SOLN
INTRAVENOUS | Status: AC
  Filled 2015-04-25: qty 2

## 2015-04-25 MED ORDER — NALOXONE HCL 0.4 MG/ML IJ SOLN
INTRAMUSCULAR | Status: AC
Start: 2015-04-25 — End: 2015-04-26
  Filled 2015-04-25: qty 1

## 2015-04-25 MED ORDER — SODIUM CHLORIDE 0.9 % IV SOLN: 250.0000 mL | INTRAVENOUS | Status: DC | PRN

## 2015-04-25 MED ORDER — FENTANYL CITRATE (PF) 100 MCG/2ML IJ SOLN
50.0000 ug | INTRAMUSCULAR | Status: DC | PRN
  Filled 2015-04-25: qty 2

## 2015-04-25 MED ORDER — SODIUM CHLORIDE 0.9 % IV SOLN
2.0000 g | Freq: Four times a day (QID) | INTRAVENOUS | Status: DC
  Filled 2015-04-25: qty 2000

## 2015-04-25 MED ORDER — SUCCINYLCHOLINE CHLORIDE 20 MG/ML IJ SOLN
INTRAMUSCULAR | Status: AC | PRN
  Administered 2015-04-25: 200 mg via INTRAVENOUS

## 2015-04-25 MED ORDER — SUCCINYLCHOLINE CHLORIDE 20 MG/ML IJ SOLN
INTRAMUSCULAR | Status: AC
Start: 2015-04-25 — End: 2015-04-26
  Filled 2015-04-25: qty 1

## 2015-04-25 MED ORDER — PROPOFOL 1000 MG/100ML IV EMUL
0.0000 ug/kg/min | INTRAVENOUS | Status: DC
  Administered 2015-04-25: 35 ug/kg/min via INTRAVENOUS
  Filled 2015-04-25: qty 100

## 2015-04-25 MED ORDER — VANCOMYCIN HCL 10 G IV SOLR
1500.0000 mg | INTRAVENOUS | Status: DC
  Filled 2015-04-25: qty 1500

## 2015-04-25 MED ORDER — DEXTROSE 5 % IV SOLN
2.0000 g | Freq: Two times a day (BID) | INTRAVENOUS | Status: DC
  Administered 2015-04-25: 2 g via INTRAVENOUS
  Filled 2015-04-25 (×3): qty 2

## 2015-04-25 MED ORDER — HEPARIN SODIUM (PORCINE) 5000 UNIT/ML IJ SOLN
5000.0000 [IU] | Freq: Three times a day (TID) | INTRAMUSCULAR | Status: DC
  Administered 2015-04-25 – 2015-04-26 (×2): 5000 [IU] via SUBCUTANEOUS
  Filled 2015-04-25 (×5): qty 1

## 2015-04-25 MED ORDER — VANCOMYCIN HCL 10 G IV SOLR
2500.0000 mg | Freq: Once | INTRAVENOUS | Status: AC
  Administered 2015-04-25: 2500 mg via INTRAVENOUS
  Filled 2015-04-25: qty 2500

## 2015-04-25 MED ORDER — PROPOFOL 1000 MG/100ML IV EMUL
INTRAVENOUS | Status: AC
  Filled 2015-04-25: qty 100

## 2015-04-25 MED ORDER — LIDOCAINE HCL (CARDIAC) 20 MG/ML IV SOLN
INTRAVENOUS | Status: AC
  Filled 2015-04-25: qty 5

## 2015-04-25 MED ORDER — ETOMIDATE 2 MG/ML IV SOLN
INTRAVENOUS | Status: AC | PRN
  Administered 2015-04-25: 20 mg via INTRAVENOUS

## 2015-04-25 MED ORDER — FAMOTIDINE IN NACL 20-0.9 MG/50ML-% IV SOLN
20.0000 mg | Freq: Two times a day (BID) | INTRAVENOUS | Status: DC
  Filled 2015-04-25 (×2): qty 50

## 2015-04-25 MED ORDER — NALOXONE HCL 0.4 MG/ML IJ SOLN
INTRAMUSCULAR | Status: AC | PRN
  Administered 2015-04-25: 0.4 mg via INTRAVENOUS

## 2015-04-25 MED ORDER — SODIUM CHLORIDE 0.9 % IV SOLN
INTRAVENOUS | Status: DC
  Administered 2015-04-25: 22:00:00 via INTRAVENOUS

## 2015-04-25 MED ORDER — INSULIN ASPART 100 UNIT/ML ~~LOC~~ SOLN
2.0000 [IU] | SUBCUTANEOUS | Status: DC
  Administered 2015-04-26: 4 [IU] via SUBCUTANEOUS
  Administered 2015-04-26: 2 [IU] via SUBCUTANEOUS

## 2015-04-25 MED ORDER — ETOMIDATE 2 MG/ML IV SOLN
INTRAVENOUS | Status: AC
  Filled 2015-04-25: qty 20

## 2015-04-25 MED ORDER — FENTANYL CITRATE (PF) 100 MCG/2ML IJ SOLN
50.0000 ug | INTRAMUSCULAR | Status: DC | PRN
  Administered 2015-04-26 (×2): 50 ug via INTRAVENOUS
  Filled 2015-04-25: qty 2

## 2015-04-25 MED ORDER — PROPOFOL 1000 MG/100ML IV EMUL
5.0000 ug/kg/min | Freq: Once | INTRAVENOUS | Status: AC
  Administered 2015-04-25: 50 ug/kg/min via INTRAVENOUS
  Filled 2015-04-25: qty 100

## 2015-04-25 NOTE — ED Notes (Signed)
Pt back from CT with RN

## 2015-04-25 NOTE — Sedation Documentation (Signed)
Radiology at bedside

## 2015-04-25 NOTE — ED Notes (Signed)
Renae Fickle, CCM back in to speak with family

## 2015-04-25 NOTE — Progress Notes (Signed)
Patient transported to CT and back with no apparent complications. RT will continue to monitor.

## 2015-04-25 NOTE — Progress Notes (Addendum)
Chaplain responded to page.  Met with family during CT, close family with good support.  When patient was moved to 69M, chaplain escorted family members to waiting room and oriented them to the area.  Told RN family location.  Rev. [place], [place]

## 2015-04-25 NOTE — Progress Notes (Signed)
ANTIBIOTIC CONSULT NOTE - INITIAL  Pharmacy Consult for vancomycin, ampicillin, ceftriaxone Indication: meningitis   Allergies  Allergen Reactions  . Other Other (See Comments)    Anesthetics during lumbar laminectomy surgery - blood pressure increased quickly   . Rosuvastatin Other (See Comments)     myalgias  . Tadalafil Other (See Comments)     headache, elevated blood pressure  . Tramadol Hcl Other (See Comments)     Didnt help Headaches and patient gets dopey on med    Patient Measurements: Height:  (188 cm) Weight: (!) 310 lb (140.615 kg) IBW/kg (Calculated) : 82.2 Adjusted Body Weight:   Vital Signs: Temp: 95.5 F (35.3 C) (07/21 2100) BP: 123/50 mmHg (07/21 2100) Pulse Rate: 75 (07/21 2100) Intake/Output from previous day:   Intake/Output from this shift: Total I/O In: -  Out: 550 [Urine:550]  Labs:  Recent Labs  2015-05-22 1905 05-22-15 1907  WBC 12.6*  --   HGB 17.1* 18.4*  PLT 200  --   CREATININE 1.43* 1.40*   Estimated Creatinine Clearance: 69.1 mL/min (by C-G formula based on Cr of 1.4). No results for input(s): VANCOTROUGH, VANCOPEAK, VANCORANDOM, GENTTROUGH, GENTPEAK, GENTRANDOM, TOBRATROUGH, TOBRAPEAK, TOBRARND, AMIKACINPEAK, AMIKACINTROU, AMIKACIN in the last 72 hours.   Microbiology: No results found for this or any previous visit (from the past 720 hour(s)).  Medical History: Past Medical History  Diagnosis Date  . Hyperlipidemia     [place] Study LDL goal = < 130 if non smoker (he smokes 1 cigar/mouth)  . Hypertension   . Diverticulosis of colon   . Sleep apnea     CPAP Rxed by Dr Marylou Flesher  . Spinal stenosis   . Benign prostatic hypertrophy   . VT (ventricular tachycardia) 04/2011    post op  . Sleep apnea   . DVT (deep venous thrombosis)   . Frontotemporal dementia     Dr Olena Heckle    Medications:   (Not in a hospital admission) Assessment:  74 yo male found unresponsive by wife, covered in feces, emesis and  urine. Has hx of dyslipidemia, HTN, frontotemporal dementia. WBC 12.6, LA 7.9, currently hypothermic, Scr 1.4, eCrCl 45-50 ml/min. Initiating abx for possible meningitis.   Goal of Therapy:  Vancomycin trough level 15-20 mcg/ml  Plan:  -Ceftriaxone 2g/12h -Ampicillin 2g/6h -Vancomcyin 2500 mg IV x1, then 1500 mg IV q24h -Monitor renal fx, cultures, VT at Css in body habitus  -Deescalate abx as able     Agapito Games, PharmD, BCPS Clinical Pharmacist Pager: [phone] 05-22-15 9:46 PM

## 2015-04-25 NOTE — H&P (Signed)
PULMONARY / CRITICAL CARE MEDICINE   Name: [name] MRN: [id number] DOB: Sep 16, 1941    ADMISSION DATE:  04/21/2015 CONSULTATION DATE:  04/27/2015  REFERRING MD :  EDP  CHIEF COMPLAINT:  AMS  INITIAL PRESENTATION: 74 year old male with mild dementia found down at home 7/21, was brought to ED and intubated for airway protection with GCS 3. Was hypothermic with leukocytosis and met acidosis in ED. No obvious sings of infection. PCCM to admit.  STUDIES:  CT head/Cspine 7/21 > Similar findings of centralized atrophy without acute intracranial process. No fracture static subluxation of the cervical spine. Mild-to-moderate multilevel cervical spine DDD.  SIGNIFICANT EVENTS:  HISTORY OF PRESENT ILLNESS:  74 year old male with PMH of OSA, HLD, HTN, DVT, and frontotemporal dementia. He is still fairly independent, lives with wife, and is able to drive, manage most ADLs on his own, she reports that most of his symptoms are behavioral (eg. Saying inappropriate/mean things). Also had L2-L5 decompressive laminectomy in 7169 which was complicated by post op fever and SVT. He has a normal routine. He wakes up around 430 AM, eats breakfast, and after his wife goes to work at [employer], he goes to [employer] and/or [employer] and will be there until about 1PM. He comes home eats dinner and goes to sleep around 5pm, always wearing his CPAP. His wife left for work this morning at 730 am, and that is the last time he was seen to be normal. He had no complaints at that time, and was eating breakfast as he normally does. When she came home this evening at about 545, she noticed that the car was parked crooked in the garage, and there was a foul odor in the house. She found him to be seated in the bedroom propped up against the bed. He had his CPAP on, so she was given the impression that he went to bed as he normally does, and went down while trying to get out of bed with CPAP on, this is only speculation, however.  Family called [employer].  PAST MEDICAL HISTORY :   has a past medical history of Hyperlipidemia; Hypertension; Diverticulosis of colon; Sleep apnea; Spinal stenosis; Benign prostatic hypertrophy; VT (ventricular tachycardia) (04/2011); Sleep apnea; DVT (deep venous thrombosis); and Frontotemporal dementia.  has past surgical history that includes Hemorrhoid surgery; Anal fistulectomy; Lumbar laminectomy (1975); Colonoscopy; Tonsillectomy; Vasectomy; SPINAL TAP (07/2010); NEUROPSYCH EVAL; and Lumbar laminectomy (04/2011). Prior to Admission medications   Medication Sig Start Date End Date Taking? Authorizing Provider  enalapril (VASOTEC) 20 MG tablet Take 20 mg by mouth daily.   Yes Historical Provider, MD  furosemide (LASIX) 40 MG tablet Take 1 tablet (40 mg total) by mouth daily. 12/14/12  Yes Hendricks Limes, MD  gabapentin (NEURONTIN) 300 MG capsule Take 600 mg by mouth 3 (three) times daily.    Yes Historical Provider, MD  meloxicam (MOBIC) 7.5 MG tablet Take 7.5 mg by mouth 2 (two) times daily.   Yes Historical Provider, MD  memantine (NAMENDA) 10 MG tablet Take 10 mg by mouth 2 (two) times daily.    Yes Historical Provider, MD  metFORMIN (GLUCOPHAGE) 500 MG tablet Take 500 mg by mouth 2 (two) times daily with a meal.   Yes Historical Provider, MD  simvastatin (ZOCOR) 40 MG tablet Take 1 tablet (40 mg total) by mouth at bedtime. 12/14/12  Yes Hendricks Limes, MD  enalapril (VASOTEC) 20 MG tablet Take 1 tablet (20 mg total) by mouth daily.  12/14/12 12/14/13  Hendricks Limes, MD  fish oil-omega-3 fatty acids 1000 MG capsule Take 1 g by mouth daily.     Historical Provider, MD  Flaxseed, Linseed, (FLAX SEED OIL) 1000 MG CAPS Take 1,000 mg by mouth daily.     Historical Provider, MD  Fluticasone-Salmeterol (ADVAIR DISKUS) 250-50 MCG/DOSE AEPB Inhale 1 puff into the lungs 2 (two) times daily. 10/07/12   Hendricks Limes, MD  modafinil (PROVIGIL) 200 MG tablet Take 200 mg by mouth every morning.      Historical Provider, MD  Multiple Vitamin (MULTIVITAMIN WITH MINERALS) TABS Take 1 tablet by mouth daily.    Historical Provider, MD   Allergies  Allergen Reactions  . Other Other (See Comments)    Anesthetics during lumbar laminectomy surgery - blood pressure increased quickly   . Rosuvastatin Other (See Comments)     myalgias  . Tadalafil Other (See Comments)     headache, elevated blood pressure  . Tramadol Hcl Other (See Comments)     Didnt help Headaches and patient gets dopey on med    FAMILY HISTORY:  indicated that his mother is deceased.  SOCIAL HISTORY:  reports that he has been smoking Cigars.  He has never used smokeless tobacco. He reports that he drinks about 12.6 oz of alcohol per week. He reports that he does not use illicit drugs.  REVIEW OF SYSTEMS:  Unable  SUBJECTIVE:   VITAL SIGNS: Temp:  [95.5 F (35.3 C)] 95.5 F (35.3 C) (07/21 2048) Pulse Rate:  [73-103] 73 (07/21 2048) Resp:  [14-20] 17 (07/21 2048) BP: (129-194)/(59-94) 129/59 mmHg (07/21 2048) SpO2:  [99 %-100 %] 100 % (07/21 2048) FiO2 (%):  [80 %-100 %] 80 % (07/21 2007) Weight:  [136.079 kg (300 lb)-140.615 kg (310 lb)] 140.615 kg (310 lb) (07/21 1923) HEMODYNAMICS:   VENTILATOR SETTINGS: Vent Mode:  [-] PRVC FiO2 (%):  [80 %-100 %] 80 % Set Rate:  [14 bmp] 14 bmp Vt Set:  [620 mL] 620 mL PEEP:  [5 cmH20] 5 cmH20 Plateau Pressure:  [18 cmH20] 18 cmH20 INTAKE / OUTPUT:  Intake/Output Summary (Last 24 hours) at 04/27/2015 2053 Last data filed at 04/19/2015 1906  Gross per 24 hour  Intake      0 ml  Output    550 ml  Net   -550 ml    PHYSICAL EXAMINATION: General:  Morbidly obese male in NAD Neuro:  Sedated on vent, rectal sphincter tone somewhat diminished. HEENT:  [place]/AT, C collar, in place, PERRL, sluggish. Cardiovascular:  RRR, no MRG Lungs:  Clear bilateral breath sounds Abdomen:  Obese, soft, non-distended, hypoactive.  Musculoskeletal:  No acute deformity Skin:  Grossly  intact  LABS:  CBC  Recent Labs Lab 05/04/2015 1905 04/10/2015 1907  WBC 12.6*  --   HGB 17.1* 18.4*  HCT 50.6 54.0*  PLT 200  --    Coag's  Recent Labs Lab 04/11/2015 1905  INR 1.08   BMET  Recent Labs Lab 04/16/2015 1905 04/07/2015 1907  NA 141 144  K 4.9 4.8  CL 106 107  CO2 11*  --   BUN 18 24*  CREATININE 1.43* 1.40*  GLUCOSE 221* 218*   Electrolytes  Recent Labs Lab 04/22/2015 1905  CALCIUM 9.3   Sepsis Markers  Recent Labs Lab 04/05/2015 1913  LATICACIDVEN 7.92*   ABG No results for input(s): PHART, PCO2ART, PO2ART in the last 168 hours. Liver Enzymes  Recent Labs Lab 04/21/2015 1905  AST 43*  ALT  47  ALKPHOS 49  BILITOT 0.7  ALBUMIN 4.2   Cardiac Enzymes No results for input(s): TROPONINI, PROBNP in the last 168 hours. Glucose No results for input(s): GLUCAP in the last 168 hours.  Imaging Ct Head Wo Contrast  04/16/2015   CLINICAL DATA:  Dementia.  Altered mental status.  EXAM: CT HEAD WITHOUT CONTRAST  CT CERVICAL SPINE WITHOUT CONTRAST  TECHNIQUE: Multidetector CT imaging of the head and cervical spine was performed following the standard protocol without intravenous contrast. Multiplanar CT image reconstructions of the cervical spine were also generated.  COMPARISON:  Head CT - 04/17/2011; 04/10/2009  FINDINGS: CT HEAD FINDINGS  Similar findings of centralized atrophy with commensurate ex vacuo dilatation of the ventricular system. Bilateral basal ganglial calcifications. The gray-white differentiation is otherwise well maintained without CT evidence acute large territory infarct. No intraparenchymal or extra-axial mass or hemorrhage. Unchanged size a configuration of the ventricles and basilar cisterns. No midline shift.  The patient is intubated. There is minimal mucosal thickening of the bilateral maxillary sinuses. Remaining paranasal sinuses and mastoid air cells are normally aerated. Debris is seen within the posterior nasal pharynx, likely  secondary to intubated state.  Regional soft tissues appear normal. No displaced calvarial fracture.  CT CERVICAL SPINE FINDINGS  C1 to the superior endplate of T2 is imaged.  Normal alignment of the cervical spine. No anterolisthesis or retrolisthesis. The bilateral facets are normally aligned. The dens is normally positioned between the lateral masses of C1. Normal atlantodental and atlantoaxial articulations.  No fracture or static subluxation of the cervical spine. Cervical vertebral body heights are preserved. Prevertebral soft tissues are normal.  Mild to moderate multilevel cervical spine DDD, worse at C5-C6 and C6-C7 with disc space height loss, endplate irregularity and sclerosis.  There is partial ossification of the nuchal ligament posterior to the C5, C6 and C7 spinous processes.  Note is made of a peripherally calcified approximately 0.8 cm) nodule within the caudal aspect of the right lobe of the thyroid (84, series 302). Regional soft tissues appear otherwise normal.  Endotracheal and enteric tube tips terminate below image field-of-view.  Limited visualization of lung apices demonstrates minimal biapical pleural parenchymal thickening.  IMPRESSION: 1. Similar findings of centralized atrophy without acute intracranial process. 2. No fracture static subluxation of the cervical spine. 3. Mild-to-moderate multilevel cervical spine DDD.   Electronically Signed   By: Sandi Mariscal M.D.   On: 05/02/2015 20:07   Ct Cervical Spine Wo Contrast  04/24/2015   CLINICAL DATA:  Dementia.  Altered mental status.  EXAM: CT HEAD WITHOUT CONTRAST  CT CERVICAL SPINE WITHOUT CONTRAST  TECHNIQUE: Multidetector CT imaging of the head and cervical spine was performed following the standard protocol without intravenous contrast. Multiplanar CT image reconstructions of the cervical spine were also generated.  COMPARISON:  Head CT - 04/17/2011; 04/10/2009  FINDINGS: CT HEAD FINDINGS  Similar findings of centralized atrophy  with commensurate ex vacuo dilatation of the ventricular system. Bilateral basal ganglial calcifications. The gray-white differentiation is otherwise well maintained without CT evidence acute large territory infarct. No intraparenchymal or extra-axial mass or hemorrhage. Unchanged size a configuration of the ventricles and basilar cisterns. No midline shift.  The patient is intubated. There is minimal mucosal thickening of the bilateral maxillary sinuses. Remaining paranasal sinuses and mastoid air cells are normally aerated. Debris is seen within the posterior nasal pharynx, likely secondary to intubated state.  Regional soft tissues appear normal. No displaced calvarial fracture.  CT CERVICAL SPINE FINDINGS  C1 to the superior endplate of T2 is imaged.  Normal alignment of the cervical spine. No anterolisthesis or retrolisthesis. The bilateral facets are normally aligned. The dens is normally positioned between the lateral masses of C1. Normal atlantodental and atlantoaxial articulations.  No fracture or static subluxation of the cervical spine. Cervical vertebral body heights are preserved. Prevertebral soft tissues are normal.  Mild to moderate multilevel cervical spine DDD, worse at C5-C6 and C6-C7 with disc space height loss, endplate irregularity and sclerosis.  There is partial ossification of the nuchal ligament posterior to the C5, C6 and C7 spinous processes.  Note is made of a peripherally calcified approximately 0.8 cm) nodule within the caudal aspect of the right lobe of the thyroid (84, series 302). Regional soft tissues appear otherwise normal.  Endotracheal and enteric tube tips terminate below image field-of-view.  Limited visualization of lung apices demonstrates minimal biapical pleural parenchymal thickening.  IMPRESSION: 1. Similar findings of centralized atrophy without acute intracranial process. 2. No fracture static subluxation of the cervical spine. 3. Mild-to-moderate multilevel cervical  spine DDD.   Electronically Signed   By: Sandi Mariscal M.D.   On: 05/01/2015 20:07   Dg Chest Port 1 View  04/24/2015   CLINICAL DATA:  Intubated.  EXAM: PORTABLE CHEST - 1 VIEW  COMPARISON:  10/03/2012.  FINDINGS: Endotracheal tube in satisfactory position. Nasogastric tube extending into the stomach. Poor inspiration with no gross cardiac enlargement. Mild patchy opacity at the medial left lung base. Unremarkable bones.  IMPRESSION: 1. Endotracheal tube in satisfactory position. 2. Mild patchy atelectasis, pneumonia or aspiration pneumonitis at the medial left lung base.   Electronically Signed   By: Claudie Revering M.D.   On: 04/05/2015 20:05     ASSESSMENT / PLAN:  PULMONARY OETT 7/21 >>> A: Acute hypoxemic respiratory failure OSA on CPAP  P:   Full vent support Follow ABG CXR in AM VAP bundle Daily SBT Hold advair Duoneb q 6  CARDIOVASCULAR A:  H/o HTN, HLD Elevated lactic  P:  Telemetry monitoring Holding PO antihypertensives (enalapril, furosemide) Hold simvastatin until cleared for POs Trend lactic, troponin IVF hydration  RENAL A:   AKI High AG metabolic acidosis, lactic, ?metformin related  P:   IVF hydration Follow Bmet Correct electrolytes as indicated  GASTROINTESTINAL A:   Diarrhea  P:   NPO for now Pepcid for SUP  HEMATOLOGIC A:   H/o DVT   P:  Follow CBC Heparin for VTE ppx  INFECTIOUS A:   CAP vs aspiration ? Meningitis, epidural abscess with history of back surgeries  Leukocytosis Hypothermia  P:   BCx2 7/21 >>> UC 7/21 >>> Sputum 7/21 >>> C-dif 7/21 >>> Stool cultures 7/21 >>> Abx: Ampicillin, start date 7/21 >>> Abx: Ceftriaxone, start date 7/21 >>> Abx: Azithromycin, start date 7/21 >>> Abx: Vancomycin, start date 7/21 >>> MRI L spine  Consider LP PCT algorithm  ENDOCRINE A:   DM - recently started metformin 01/2015  P:   Hold metformin CBG monitoring and SSI TSH  NEUROLOGIC A:   Acute metabolic  encephalopathy. Lactic acidosis. Ingestion?  Frontotemporal dementia ?seizure  P:   RASS goal: -1 Propofol for sedation PRN fentanyl for analgesia Daily WUA Holding preadmission namenda Serum/urine osmo Ammonia Consider EEG   FAMILY  - Updates: Family updated at bedside 7/21. He has living will, which states that he would not want heroic measures in setting of cardiac arrest. Short term intubation OK.   - Inter-disciplinary family meet or Palliative  Care meeting due by:  05/02/2015   Georgann Housekeeper, AGACNP-BC [facility] Pager [phone] or [phone]  04/06/2015 9:51 PM

## 2015-04-25 NOTE — ED Provider Notes (Signed)
CSN: [id number]     Arrival date & time 04/07/2015  1850 History   First MD Initiated Contact with Patient 04/07/2015 1911     No chief complaint on file.    (Consider location/radiation/quality/duration/timing/severity/associated sxs/prior Treatment) Patient is a 74 y.o. male presenting with altered mental status. The history is provided by the EMS personnel and a relative. The history is limited by the condition of the patient.  Altered Mental Status Presenting symptoms: lethargy, partial responsiveness and unresponsiveness   Severity:  Severe Most recent episode:  Today Episode history:  Unable to specify Timing:  Constant Progression:  Worsening Chronicity:  New Context: dementia   Context: not a nursing home resident and not a recent change in medication     Past Medical History  Diagnosis Date  . Hyperlipidemia     [place] Study LDL goal = < 130 if non smoker (he smokes 1 cigar/mouth)  . Hypertension   . Diverticulosis of colon   . Sleep apnea     CPAP Rxed by Dr Marylou Flesher  . Spinal stenosis   . Benign prostatic hypertrophy   . VT (ventricular tachycardia) 04/2011    post op  . Sleep apnea   . DVT (deep venous thrombosis)   . Frontotemporal dementia     Dr Olena Heckle   Past Surgical History  Procedure Laterality Date  . Hemorrhoid surgery    . Anal fistulectomy    . Lumbar laminectomy  1975  . Colonoscopy      tics, Dr Matthias Hughs  . Tonsillectomy    . Vasectomy    . Spinal tap  07/2010    09/200 elevated csf protein; + ANA (normal cryoglobulin, ANCA, Rehmatology evaltuaion  . Neuropsych eval      ? progressive bilat subcortical white matter disease, R/O autoimmune dementia  . Lumbar laminectomy  04/2011    Complicated by postop tachycardia, profound fever and mental status changes   Family History  Problem Relation Age of Onset  . Diabetes Mother   . Hypertension Mother   . Heart failure Mother   . Hyperlipidemia Mother   . Alcohol abuse Father   .  Heart failure Father   . Heart attack Father 59  . Hyperlipidemia Father   . Hypertension Father   . Diabetes Sister   . Hyperlipidemia Sister   . Hypertension Sister   . Stroke Neg Hx    History  Substance Use Topics  . Smoking status: Current Some Day Smoker -- 0.00 packs/day    Types: Cigars  . Smokeless tobacco: Never Used     Comment: 1 cigar / month  . Alcohol Use: 12.6 oz/week    14 Glasses of wine, 7 Shots of liquor per week    Review of Systems  Unable to perform ROS: Patient unresponsive  Gastrointestinal: Positive for diarrhea.      Allergies  Other; Rosuvastatin; Tadalafil; and Tramadol hcl  Home Medications   Prior to Admission medications   Medication Sig Start Date End Date Taking? Authorizing Provider  enalapril (VASOTEC) 20 MG tablet Take 1 tablet (20 mg total) by mouth daily. 12/14/12 12/14/13  Pecola Lawless, MD  fish oil-omega-3 fatty acids 1000 MG capsule Take 1 g by mouth daily.     Historical Provider, MD  Flaxseed, Linseed, (FLAX SEED OIL) 1000 MG CAPS Take 1,000 mg by mouth daily.     Historical Provider, MD  Fluticasone-Salmeterol (ADVAIR DISKUS) 250-50 MCG/DOSE AEPB Inhale 1 puff into the lungs 2 (two) times daily.  10/07/12   Pecola Lawless, MD  furosemide (LASIX) 40 MG tablet Take 1 tablet (40 mg total) by mouth daily. 12/14/12   Pecola Lawless, MD  gabapentin (NEURONTIN) 300 MG capsule Take 600 mg by mouth 3 (three) times daily.     Historical Provider, MD  memantine (NAMENDA) 10 MG tablet Take 10 mg by mouth 2 (two) times daily.     Historical Provider, MD  modafinil (PROVIGIL) 200 MG tablet Take 200 mg by mouth every morning.     Historical Provider, MD  Multiple Vitamin (MULTIVITAMIN WITH MINERALS) TABS Take 1 tablet by mouth daily.    Historical Provider, MD  simvastatin (ZOCOR) 40 MG tablet Take 1 tablet (40 mg total) by mouth at bedtime. 12/14/12   Pecola Lawless, MD   There were no vitals taken for this visit. Physical Exam   Constitutional: He appears well-developed and well-nourished. He appears toxic. He appears distressed.  HENT:  Head: Atraumatic.  Nose: Nose normal.  Mouth/Throat: Oropharynx is clear and moist. No oropharyngeal exudate.  Nasal trumpet in nare.  Nontraumatic HEENT exam 3 mm bilaterally and + reactive pupils.    Eyes: EOM are normal. Pupils are equal, round, and reactive to light.  Neck:  [place] c-collar placed on arrival  Cardiovascular: Normal rate, regular rhythm, normal heart sounds and intact distal pulses.   No murmur heard. Pulmonary/Chest: Effort normal and breath sounds normal. No respiratory distress. He has no wheezes. He exhibits no tenderness.  Abdominal: Soft. He exhibits no distension. There is no tenderness. There is no guarding.  abd soft and nondistended.   Soft brown semi-liquid feces all over lower extremities  Musculoskeletal:  Atraumatic extremities  Neurological: He is unresponsive. No cranial nerve deficit. Coordination normal.  Initially GCS 3.  No response to verbal or painful stimuli of any extremity.  Eventually showing spontaneous movement in all 4 extremities (after ammonia packet and narcan), upper > lower.    Skin: Skin is warm and dry. He is not diaphoretic. No pallor.  Nursing note and vitals reviewed.   ED Course  Procedures (including critical care time) Labs Review Labs Reviewed  I-STAT CHEM 8, ED - Abnormal; Notable for the following:    BUN 24 (*)    Creatinine, Ser 1.40 (*)    Glucose, Bld 218 (*)    Hemoglobin 18.4 (*)    HCT 54.0 (*)    All other components within normal limits    Imaging Review Ct Head Wo Contrast  April 27, 2015   CLINICAL DATA:  Dementia.  Altered mental status.  EXAM: CT HEAD WITHOUT CONTRAST  CT CERVICAL SPINE WITHOUT CONTRAST  TECHNIQUE: Multidetector CT imaging of the head and cervical spine was performed following the standard protocol without intravenous contrast. Multiplanar CT image reconstructions of the  cervical spine were also generated.  COMPARISON:  Head CT - 04/17/2011; 04/10/2009  FINDINGS: CT HEAD FINDINGS  Similar findings of centralized atrophy with commensurate ex vacuo dilatation of the ventricular system. Bilateral basal ganglial calcifications. The gray-white differentiation is otherwise well maintained without CT evidence acute large territory infarct. No intraparenchymal or extra-axial mass or hemorrhage. Unchanged size a configuration of the ventricles and basilar cisterns. No midline shift.  The patient is intubated. There is minimal mucosal thickening of the bilateral maxillary sinuses. Remaining paranasal sinuses and mastoid air cells are normally aerated. Debris is seen within the posterior nasal pharynx, likely secondary to intubated state.  Regional soft tissues appear normal. No displaced calvarial fracture.  CT CERVICAL SPINE FINDINGS  C1 to the superior endplate of T2 is imaged.  Normal alignment of the cervical spine. No anterolisthesis or retrolisthesis. The bilateral facets are normally aligned. The dens is normally positioned between the lateral masses of C1. Normal atlantodental and atlantoaxial articulations.  No fracture or static subluxation of the cervical spine. Cervical vertebral body heights are preserved. Prevertebral soft tissues are normal.  Mild to moderate multilevel cervical spine DDD, worse at C5-C6 and C6-C7 with disc space height loss, endplate irregularity and sclerosis.  There is partial ossification of the nuchal ligament posterior to the C5, C6 and C7 spinous processes.  Note is made of a peripherally calcified approximately 0.8 cm) nodule within the caudal aspect of the right lobe of the thyroid (84, series 302). Regional soft tissues appear otherwise normal.  Endotracheal and enteric tube tips terminate below image field-of-view.  Limited visualization of lung apices demonstrates minimal biapical pleural parenchymal thickening.  IMPRESSION: 1. Similar findings of  centralized atrophy without acute intracranial process. 2. No fracture static subluxation of the cervical spine. 3. Mild-to-moderate multilevel cervical spine DDD.   Electronically Signed   By: Simonne Come M.D.   On: 05/02/15 20:07   Ct Cervical Spine Wo Contrast  02-May-2015   CLINICAL DATA:  Dementia.  Altered mental status.  EXAM: CT HEAD WITHOUT CONTRAST  CT CERVICAL SPINE WITHOUT CONTRAST  TECHNIQUE: Multidetector CT imaging of the head and cervical spine was performed following the standard protocol without intravenous contrast. Multiplanar CT image reconstructions of the cervical spine were also generated.  COMPARISON:  Head CT - 04/17/2011; 04/10/2009  FINDINGS: CT HEAD FINDINGS  Similar findings of centralized atrophy with commensurate ex vacuo dilatation of the ventricular system. Bilateral basal ganglial calcifications. The gray-white differentiation is otherwise well maintained without CT evidence acute large territory infarct. No intraparenchymal or extra-axial mass or hemorrhage. Unchanged size a configuration of the ventricles and basilar cisterns. No midline shift.  The patient is intubated. There is minimal mucosal thickening of the bilateral maxillary sinuses. Remaining paranasal sinuses and mastoid air cells are normally aerated. Debris is seen within the posterior nasal pharynx, likely secondary to intubated state.  Regional soft tissues appear normal. No displaced calvarial fracture.  CT CERVICAL SPINE FINDINGS  C1 to the superior endplate of T2 is imaged.  Normal alignment of the cervical spine. No anterolisthesis or retrolisthesis. The bilateral facets are normally aligned. The dens is normally positioned between the lateral masses of C1. Normal atlantodental and atlantoaxial articulations.  No fracture or static subluxation of the cervical spine. Cervical vertebral body heights are preserved. Prevertebral soft tissues are normal.  Mild to moderate multilevel cervical spine DDD, worse at  C5-C6 and C6-C7 with disc space height loss, endplate irregularity and sclerosis.  There is partial ossification of the nuchal ligament posterior to the C5, C6 and C7 spinous processes.  Note is made of a peripherally calcified approximately 0.8 cm) nodule within the caudal aspect of the right lobe of the thyroid (84, series 302). Regional soft tissues appear otherwise normal.  Endotracheal and enteric tube tips terminate below image field-of-view.  Limited visualization of lung apices demonstrates minimal biapical pleural parenchymal thickening.  IMPRESSION: 1. Similar findings of centralized atrophy without acute intracranial process. 2. No fracture static subluxation of the cervical spine. 3. Mild-to-moderate multilevel cervical spine DDD.   Electronically Signed   By: Simonne Come M.D.   On: 2015/05/02 20:07   Dg Chest Port 1 View  05/02/15  CLINICAL DATA:  Intubated.  EXAM: PORTABLE CHEST - 1 VIEW  COMPARISON:  10/03/2012.  FINDINGS: Endotracheal tube in satisfactory position. Nasogastric tube extending into the stomach. Poor inspiration with no gross cardiac enlargement. Mild patchy opacity at the medial left lung base. Unremarkable bones.  IMPRESSION: 1. Endotracheal tube in satisfactory position. 2. Mild patchy atelectasis, pneumonia or aspiration pneumonitis at the medial left lung base.   Electronically Signed   By: Beckie Salts M.D.   On: 04/28/2015 20:05     EKG Interpretation   Date/Time:  Thursday April 25 2015 18:57:43 EDT Ventricular Rate:  92 PR Interval:  207 QRS Duration: 92 QT Interval:  359 QTC Calculation: 444 R Axis:   60 Text Interpretation:  Sinus rhythm Borderline prolonged PR interval  Minimal ST depression, inferior leads ED PHYSICIAN INTERPRETATION  AVAILABLE IN [facility] Confirmed by TEST, Record ([id number]) on  May 22, 2015 6:51:54 AM      MDM   Final diagnoses:  Glasgow coma scale total score 3-8   Pt is a 74 yo M with hx of frontotemporal dementia, HTN, DM  who presents unresponsive.  He has a hx of behavior changes from his dementia but otherwise is pretty capable, able to drive still, take care of ADLs, etc.  He was last seen this morning in normal state of health before wife left at 0730 for work.  He has not had any recent illnesses, change in meds, or falls prior to today.  No diarrhea that wife knew of this AM.  Wife returned from the work tonight and found him sitting down next to his bed on the floor, with feces on the bed and on the ground.   Looked like he slid out of the bed to the ground but did not look like hit his head (was still sitting up, propped up next to bed).  He initially was minimally responsive and saying occasional words when wife found him.   EMS found him minimally responsive but protecting his airway.  O2 sats in upper 90s on room air.  Glucose > 200.  Good distal pulses.    Presented to the ED unresponsive, with stool all over his legs and abdomen.  Initial GCS 3, sonorous respirations but maintaining O2 sats.   Nasal trumpet in right nare.  O2 sats in upper 90s on room air but placed on nonrebreather 2/2 AMS.   3 mm bilateral pupils, reactive.  No spontaneous movement or withdrawal from painful stimuli initially.    Broke 2x ammonia tablets and he woke up slightly.  Seen only moving upper extremities at that time, so placed in C-collar with concern for possible significant fall causing the injury.  No change with narcan 2mg .  Still with good spontaneous breathing.    After a few minutes in the ED, he began to localize his head towards saying his name and was seen moving all four extremities to pain.   Family arrived to the ED and the story was confirmed.  Goals of care were discussed and wife reported that he would not want to be maintained long term on vent support, but after discussion with daughter over the phone, they decided to make him full code for now until diagnosis could be made and he can be stabilized.  All questions  were answered at that point with limited information available to tell family.   Decided to intubate for airway protection.  He was RSI'd with etomodate and succinylcholine.  Intubated with glidescope size 4,  8-0 ETT placed at 25 at the lip.  Good end tidal CO2 color change, good tube fogging, bilateral breath sounds, and no sounds over the epigastrium.  CXR shows appropriate positioning.   Started on propofol drip for sedation, which helped drop his BP.  (initially 190s systolic and brought down to the 140s systolic with sedation).   To get CT head and labs to work up for altered mental status/tox labs.    CT head and c-spine with no acute changes.  CXR with no signs of infection.   UDS and EtOH negative.  No salicylates or acetaminophen.  Trop 0.01.  Lactic acidosis at 7.9.  Given 3rd L NS bolus.   ABG showing metabolic acidosis.   Blood and urine cultures obtained.  No obvious signs of what has caused his profound altered mental status at this time.   Called for admission to ICU for further work up.  They will do an LP when able to evaluate for meningitis.    If performed, labs, EKGs, and imaging were reviewed and interpreted by myself and my attending, and incorporated in the medical decision making.  Patient was seen with ED Attending, Dr. Leida Lauth, MD     Lenell Antu, MD 04/10/2015 1610  Geoffery Lyons, MD 04/10/2015 [id number]

## 2015-04-25 NOTE — ED Notes (Signed)
Paul with CCM at the bedside.

## 2015-04-25 NOTE — ED Notes (Signed)
Lab and RT at the bedside.

## 2015-04-25 NOTE — ED Notes (Signed)
Pt here via [facility] from home.  LSN by wife when she left for work at 0730 this am.  When she came home from work this evening he was laying on the bedroom floor, unresponsive, snoring rr, an covered in feces, urine and vomit.  Feces were also on the bed.  Nasal trumpet placed by EMS.

## 2015-04-25 NOTE — Sedation Documentation (Signed)
No response to narcan

## 2015-04-26 ENCOUNTER — Inpatient Hospital Stay (HOSPITAL_COMMUNITY): Payer: Medicare Other

## 2015-04-26 DIAGNOSIS — R40243 Glasgow coma scale score 3-8, unspecified time: Secondary | ICD-10-CM | POA: Insufficient documentation

## 2015-04-26 DIAGNOSIS — N179 Acute kidney failure, unspecified: Secondary | ICD-10-CM

## 2015-04-26 DIAGNOSIS — E872 Acidosis, unspecified: Secondary | ICD-10-CM | POA: Insufficient documentation

## 2015-04-26 DIAGNOSIS — R404 Transient alteration of awareness: Secondary | ICD-10-CM

## 2015-04-26 DIAGNOSIS — L8990 Pressure ulcer of unspecified site, unspecified stage: Secondary | ICD-10-CM | POA: Insufficient documentation

## 2015-04-26 DIAGNOSIS — J9601 Acute respiratory failure with hypoxia: Secondary | ICD-10-CM | POA: Insufficient documentation

## 2015-04-26 LAB — BLOOD GAS, ARTERIAL
ACID-BASE DEFICIT: 25.3 mmol/L — AB (ref 0.0–2.0)
Acid-base deficit: 26.7 mmol/L — ABNORMAL HIGH (ref 0.0–2.0)
Acid-base deficit: 26 mmol/L — ABNORMAL HIGH (ref 0.0–2.0)
BICARBONATE: 3.1 meq/L — AB (ref 20.0–24.0)
Bicarbonate: 4.2 mEq/L — ABNORMAL LOW (ref 20.0–24.0)
Bicarbonate: 3.8 mEq/L — ABNORMAL LOW (ref 20.0–24.0)
DRAWN BY: 11249
DRAWN BY: 11249
Drawn by: 11249
FIO2: 0.6 %
FIO2: 0.4 %
FIO2: 0.4 %
LHR: 30 {breaths}/min
LHR: 22 {breaths}/min
MECHVT: 650 mL
MECHVT: 650 mL
MECHVT: 650 mL
O2 SAT: 98 %
O2 Saturation: 98.2 %
O2 Saturation: 98.1 %
PATIENT TEMPERATURE: 97.7
PCO2 ART: 17.7 mmHg — AB (ref 35.0–45.0)
PEEP/CPAP: 5 cmH2O
PEEP: 5 cmH2O
PEEP: 5 cmH2O
PH ART: 6.93 — AB (ref 7.350–7.450)
PO2 ART: 203 mmHg — AB (ref 80.0–100.0)
PO2 ART: 290 mmHg — AB (ref 80.0–100.0)
Patient temperature: 94.4
Patient temperature: 96.5
RATE: 30 resp/min
TCO2: 4.8 mmol/L (ref 0–100)
TCO2: 3.6 mmol/L (ref 0–100)
TCO2: 4.4 mmol/L (ref 0–100)
pCO2 arterial: 14.2 mmHg — CL (ref 35.0–45.0)
pCO2 arterial: 19 mmHg — CL (ref 35.0–45.0)
pH, Arterial: 6.986 — CL (ref 7.350–7.450)
pH, Arterial: 6.96 — CL (ref 7.350–7.450)
pO2, Arterial: 192 mmHg — ABNORMAL HIGH (ref 80.0–100.0)

## 2015-04-26 LAB — GLUCOSE, CAPILLARY
GLUCOSE-CAPILLARY: 144 mg/dL — AB (ref 65–99)
GLUCOSE-CAPILLARY: 152 mg/dL — AB (ref 65–99)
GLUCOSE-CAPILLARY: 221 mg/dL — AB (ref 65–99)

## 2015-04-26 LAB — CBC
HCT: 46.8 % (ref 39.0–52.0)
HEMOGLOBIN: 15.1 g/dL (ref 13.0–17.0)
MCH: 31.8 pg (ref 26.0–34.0)
MCHC: 32.3 g/dL (ref 30.0–36.0)
MCV: 98.5 fL (ref 78.0–100.0)
Platelets: 233 10*3/uL (ref 150–400)
RBC: 4.75 MIL/uL (ref 4.22–5.81)
RDW: 13.6 % (ref 11.5–15.5)
WBC: 19 10*3/uL — ABNORMAL HIGH (ref 4.0–10.5)

## 2015-04-26 LAB — OSMOLALITY, URINE: OSMOLALITY UR: 497 mosm/kg (ref 390–1090)

## 2015-04-26 LAB — CG4 I-STAT (LACTIC ACID): LACTIC ACID, VENOUS: 8.3 mmol/L — AB (ref 0.5–2.0)

## 2015-04-26 LAB — BASIC METABOLIC PANEL
ANION GAP: 34 — AB (ref 5–15)
BUN: 20 mg/dL (ref 6–20)
CO2: 6 mmol/L — AB (ref 22–32)
CREATININE: 1.89 mg/dL — AB (ref 0.61–1.24)
Calcium: 8.7 mg/dL — ABNORMAL LOW (ref 8.9–10.3)
Chloride: 106 mmol/L (ref 101–111)
GFR calc Af Amer: 39 mL/min — ABNORMAL LOW (ref >60)
GFR calc non Af Amer: 33 mL/min — ABNORMAL LOW (ref >60)
Glucose, Bld: 166 mg/dL — ABNORMAL HIGH (ref 65–99)
Potassium: 6 mmol/L — ABNORMAL HIGH (ref 3.5–5.1)
Sodium: 146 mmol/L — ABNORMAL HIGH (ref 135–145)

## 2015-04-26 LAB — MRSA PCR SCREENING: MRSA by PCR: NEGATIVE

## 2015-04-26 LAB — TROPONIN I: Troponin I: <0.03 ng/mL (ref <0.031)

## 2015-04-26 LAB — TRIGLYCERIDES: TRIGLYCERIDES: 355 mg/dL — AB (ref <150)

## 2015-04-26 LAB — PHOSPHORUS: PHOSPHORUS: 6.3 mg/dL — AB (ref 2.5–4.6)

## 2015-04-26 LAB — LACTIC ACID, PLASMA: Lactic Acid, Venous: >30 mmol/L (ref 0.5–2.0)

## 2015-04-26 LAB — PROCALCITONIN: Procalcitonin: <0.1 ng/mL

## 2015-04-26 LAB — OSMOLALITY: Osmolality: 433 mOsm/kg — ABNORMAL HIGH (ref 275–300)

## 2015-04-26 LAB — ALKALINE PHOSPHATASE: Alkaline Phosphatase: 48 U/L (ref 38–126)

## 2015-04-26 LAB — AMMONIA: Ammonia: 112 umol/L — ABNORMAL HIGH (ref 9–35)

## 2015-04-26 LAB — MAGNESIUM: Magnesium: 2.4 mg/dL (ref 1.7–2.4)

## 2015-04-26 LAB — AMYLASE: Amylase: 66 U/L (ref 28–100)

## 2015-04-26 LAB — LIPASE, BLOOD: Lipase: 72 U/L — ABNORMAL HIGH (ref 22–51)

## 2015-04-26 MED ORDER — SODIUM CHLORIDE 0.9 % IV SOLN
2.0000 g | Freq: Four times a day (QID) | INTRAVENOUS | Status: DC
  Filled 2015-04-26 (×4): qty 2000

## 2015-04-26 MED ORDER — STERILE WATER FOR INJECTION IV SOLN
INTRAVENOUS | Status: DC
  Administered 2015-04-26: 03:00:00 via INTRAVENOUS
  Filled 2015-04-26 (×5): qty 850

## 2015-04-26 MED ORDER — SODIUM POLYSTYRENE SULFONATE 15 GM/60ML PO SUSP
30.0000 g | Freq: Once | ORAL | Status: AC
  Administered 2015-04-26: 30 g via RECTAL
  Filled 2015-04-26: qty 120

## 2015-04-26 MED ORDER — FENTANYL CITRATE (PF) 100 MCG/2ML IJ SOLN
50.0000 ug | Freq: Once | INTRAMUSCULAR | Status: AC
Start: 2015-04-26 — End: 2015-04-26
  Administered 2015-04-26: 50 ug via INTRAVENOUS

## 2015-04-26 MED ORDER — METOPROLOL TARTRATE 1 MG/ML IV SOLN
INTRAVENOUS | Status: AC
  Administered 2015-04-26: 2.5 mg via INTRAVENOUS
  Filled 2015-04-26: qty 10

## 2015-04-26 MED ORDER — FENTANYL BOLUS VIA INFUSION
25.0000 ug | INTRAVENOUS | Status: DC | PRN
  Filled 2015-04-26: qty 25

## 2015-04-26 MED ORDER — SODIUM BICARBONATE 8.4 % IV SOLN
100.0000 meq | Freq: Once | INTRAVENOUS | Status: AC
  Administered 2015-04-26: 100 meq via INTRAVENOUS

## 2015-04-26 MED ORDER — SODIUM BICARBONATE 8.4 % IV SOLN
INTRAVENOUS | Status: AC
  Filled 2015-04-26: qty 100

## 2015-04-26 MED ORDER — METOPROLOL TARTRATE 1 MG/ML IV SOLN
2.5000 mg | INTRAVENOUS | Status: DC | PRN
  Administered 2015-04-26: 2.5 mg via INTRAVENOUS

## 2015-04-26 MED ORDER — SODIUM CHLORIDE 0.9 % IV SOLN: 50.0000 ug/h | INTRAVENOUS | Status: DC

## 2015-04-26 MED ORDER — SODIUM BICARBONATE 8.4 % IV SOLN
INTRAVENOUS | Status: AC
  Administered 2015-04-26: 100 meq via INTRAVENOUS
  Filled 2015-04-26: qty 100

## 2015-04-26 MED ORDER — DEXTROSE 50 % IV SOLN: 50.0000 mL | Freq: Once | INTRAVENOUS | Status: DC

## 2015-04-26 MED ORDER — HEPARIN SODIUM (PORCINE) 1000 UNIT/ML DIALYSIS
1000.0000 [IU] | INTRAMUSCULAR | Status: DC | PRN
  Filled 2015-04-26: qty 3
  Filled 2015-04-26: qty 6

## 2015-04-26 MED ORDER — CHLORHEXIDINE GLUCONATE 0.12 % MT SOLN
15.0000 mL | Freq: Two times a day (BID) | OROMUCOSAL | Status: DC
  Administered 2015-04-26 (×2): 15 mL via OROMUCOSAL
  Filled 2015-04-26 (×2): qty 15

## 2015-04-26 MED ORDER — DEXMEDETOMIDINE HCL IN NACL 200 MCG/50ML IV SOLN: 0.0000 ug/kg/h | INTRAVENOUS | Status: DC

## 2015-04-26 MED ORDER — DEXTROSE 5 % IV SOLN
500.0000 mg | INTRAVENOUS | Status: DC
  Administered 2015-04-26: 500 mg via INTRAVENOUS
  Filled 2015-04-26 (×2): qty 500

## 2015-04-26 MED ORDER — CETYLPYRIDINIUM CHLORIDE 0.05 % MT LIQD
7.0000 mL | Freq: Four times a day (QID) | OROMUCOSAL | Status: DC
  Administered 2015-04-26: 7 mL via OROMUCOSAL

## 2015-04-26 MED ORDER — INSULIN GLARGINE 100 UNIT/ML ~~LOC~~ SOLN
10.0000 [IU] | Freq: Every day | SUBCUTANEOUS | Status: DC
  Filled 2015-04-26: qty 0.1

## 2015-04-26 MED ORDER — SODIUM CHLORIDE 0.9 % IV SOLN
25.0000 ug/h | INTRAVENOUS | Status: DC
  Administered 2015-04-26: 100 ug/h via INTRAVENOUS
  Filled 2015-04-26: qty 50

## 2015-04-27 LAB — URINE CULTURE: Culture: NO GROWTH

## 2015-04-28 LAB — CULTURE, BLOOD (ROUTINE X 2)

## 2015-04-29 ENCOUNTER — Telehealth: Payer: Self-pay

## 2015-04-29 NOTE — Telephone Encounter (Signed)
On 04/29/2015 I received a death certificate from [employer]. The death certificate is for cremation. The patient is a patient of Marga Melnick. The death certificate will be taken to the primary care unit this am for signature. On 2015-05-26 I received the death certificate back from Doctor Vassie Loll. I got the death certificate ready for pickup. I called the funeral home to let them know it was ready for pickup.

## 2015-04-30 LAB — CULTURE, BLOOD (ROUTINE X 2): Culture: NO GROWTH

## 2015-05-06 NOTE — Progress Notes (Signed)
Pt systolic blood pressure at 0845 in the 60s, retaken 15 minutes later, systolic blood pressure in the 40s. Witnessed questionable seizure activity that resolved within 30 seconds.  Resident notified and came to bedside. Pt DNR. Orders given for 1L NS bolus. Pressure did not respond. Family called by resident and were quickly at bedside. Pt became bradycardic which progressed to asystole. Time of death 64. Confirmed by Dr. Craige Cotta as well as Toniann Fail RN, and Layne Benton RN. Ventilator stopped by RT. No breath sounds or heart sounds auscultated at that time. Pupils fixed and dilated. Family present at time of death. [facility] notified at [phone] Corn to call CDS to notify time of death.

## 2015-05-06 NOTE — Consult Note (Signed)
Referring Provider: No ref. provider found Primary Care Physician:  Marga Melnick, MD Primary Nephrologist:    Reason for Consultation:   Acute non oliguric renal failure  Metabolic acidosis HPI:  74 year old man with a history of dementia that lives at home with wife. He was found clearly confused by his wife with altered mental status and sitting on ground in his feces with lethargy. EMS was called - pateint was intubated and transferred to [facility]  We were asked to evaluate the patient with renal failure and metabolic acidosis   He has a history of HTN and diabetes and uses both metformin and enalopril.   Baseline creatinine 1.2     Past Medical History  Diagnosis Date  . Hyperlipidemia     [place] Study LDL goal = < 130 if non smoker (he smokes 1 cigar/mouth)  . Hypertension   . Diverticulosis of colon   . Sleep apnea     CPAP Rxed by Dr Marylou Flesher  . Spinal stenosis   . Benign prostatic hypertrophy   . VT (ventricular tachycardia) 04/2011    post op  . Sleep apnea   . DVT (deep venous thrombosis)   . Frontotemporal dementia     Dr Olena Heckle    Past Surgical History  Procedure Laterality Date  . Hemorrhoid surgery    . Anal fistulectomy    . Lumbar laminectomy  1975  . Colonoscopy      tics, Dr Matthias Hughs  . Tonsillectomy    . Vasectomy    . Spinal tap  07/2010    09/200 elevated csf protein; + ANA (normal cryoglobulin, ANCA, Rehmatology evaltuaion  . Neuropsych eval      ? progressive bilat subcortical white matter disease, R/O autoimmune dementia  . Lumbar laminectomy  04/2011    Complicated by postop tachycardia, profound fever and mental status changes    Prior to Admission medications   Medication Sig Start Date End Date Taking? Authorizing Provider  enalapril (VASOTEC) 20 MG tablet Take 20 mg by mouth daily.   Yes Historical Provider, MD  furosemide (LASIX) 40 MG tablet Take 1 tablet (40 mg total) by mouth daily. 12/14/12  Yes Pecola Lawless, MD   gabapentin (NEURONTIN) 300 MG capsule Take 600 mg by mouth 3 (three) times daily.    Yes Historical Provider, MD  meloxicam (MOBIC) 7.5 MG tablet Take 7.5 mg by mouth 2 (two) times daily.   Yes Historical Provider, MD  memantine (NAMENDA) 10 MG tablet Take 10 mg by mouth 2 (two) times daily.    Yes Historical Provider, MD  metFORMIN (GLUCOPHAGE) 500 MG tablet Take 500 mg by mouth 2 (two) times daily with a meal.   Yes Historical Provider, MD  simvastatin (ZOCOR) 40 MG tablet Take 1 tablet (40 mg total) by mouth at bedtime. 12/14/12  Yes Pecola Lawless, MD  enalapril (VASOTEC) 20 MG tablet Take 1 tablet (20 mg total) by mouth daily. 12/14/12 12/14/13  Pecola Lawless, MD  fish oil-omega-3 fatty acids 1000 MG capsule Take 1 g by mouth daily.     Historical Provider, MD  Flaxseed, Linseed, (FLAX SEED OIL) 1000 MG CAPS Take 1,000 mg by mouth daily.     Historical Provider, MD  Fluticasone-Salmeterol (ADVAIR DISKUS) 250-50 MCG/DOSE AEPB Inhale 1 puff into the lungs 2 (two) times daily. 10/07/12   Pecola Lawless, MD  modafinil (PROVIGIL) 200 MG tablet Take 200 mg by mouth every morning.     Historical Provider,  MD  Multiple Vitamin (MULTIVITAMIN WITH MINERALS) TABS Take 1 tablet by mouth daily.    Historical Provider, MD    Current Facility-Administered Medications  Medication Dose Route Frequency Provider Last Rate Last Dose  . 0.9 %  sodium chloride infusion  250 mL Intravenous PRN Duayne Cal, NP      . 0.9 %  sodium chloride infusion   Intravenous Continuous Duayne Cal, NP 125 mL/hr at 2015/05/05 2225    . ampicillin (OMNIPEN) 2 g in sodium chloride 0.9 % 50 mL IVPB  2 g Intravenous 4 times per day Darl Householder Masters, [facility]      . antiseptic oral rinse (CPC / CETYLPYRIDINIUM CHLORIDE 0.05%) solution 7 mL  7 mL Mouth Rinse QID Cyril Mourning V, MD   7 mL at 04/12/2015 0400  . azithromycin (ZITHROMAX) 500 mg in dextrose 5 % 250 mL IVPB  500 mg Intravenous Q24H Stevphen Rochester, RPH   500 mg at 05/03/2015  0610  . cefTRIAXone (ROCEPHIN) 2 g in dextrose 5 % 50 mL IVPB  2 g Intravenous Q12H Darl Householder Masters, [facility]   Stopped at 05-05-2015 2309  . chlorhexidine (PERIDEX) 0.12 % solution 15 mL  15 mL Mouth Rinse BID Cyril Mourning V, MD   15 mL at 04/22/2015 0039  . dexmedetomidine (PRECEDEX) 200 MCG/50ML (4 mcg/mL) infusion  0-1.2 mcg/kg/hr Intravenous Continuous Oretha Milch, MD   Stopped at 04/11/2015 [phone]  . dextrose 50 % solution 50 mL  50 mL Intravenous Once Duayne Cal, NP   50 mL at 04/29/2015 0430  . famotidine (PEPCID) IVPB 20 mg premix  20 mg Intravenous Q12H Duayne Cal, NP   20 mg at 04/18/2015 0224  . fentaNYL (SUBLIMAZE) 2,500 mcg in sodium chloride 0.9 % 250 mL (10 mcg/mL) infusion  25-400 mcg/hr Intravenous Continuous Cyril Mourning V, MD 10 mL/hr at 04/28/2015 0600 100 mcg/hr at 04/17/2015 0600  . fentaNYL (SUBLIMAZE) bolus via infusion 25 mcg  25 mcg Intravenous Q1H PRN Cyril Mourning V, MD      . fentaNYL (SUBLIMAZE) injection 50 mcg  50 mcg Intravenous Q15 min PRN Duayne Cal, NP      . fentaNYL (SUBLIMAZE) injection 50 mcg  50 mcg Intravenous Q2H PRN Duayne Cal, NP   50 mcg at 04/17/2015 0530  . heparin injection 1,000-6,000 Units  1,000-6,000 Units CRRT PRN Oretha Milch, MD      . heparin injection 5,000 Units  5,000 Units Subcutaneous 3 times per day Duayne Cal, NP   5,000 Units at 2015/05/05 2228  . insulin aspart (novoLOG) injection 2-6 Units  2-6 Units Subcutaneous 6 times per day Duayne Cal, NP   2 Units at 04/15/2015 0411  . insulin glargine (LANTUS) injection 10 Units  10 Units Subcutaneous QHS Duayne Cal, NP      . lidocaine (cardiac) 100 mg/15ml (XYLOCAINE) 20 MG/ML injection 2%           . metoprolol (LOPRESSOR) injection 2.5 mg  2.5 mg Intravenous Q3H PRN Duayne Cal, NP   2.5 mg at 05/05/2015 0439  . rocuronium (ZEMURON) 50 MG/5ML injection           . sodium bicarbonate 150 mEq in sterile water 1,000 mL infusion   Intravenous Continuous Duayne Cal, NP 150 mL/hr at  05/03/2015 0600    . vancomycin (VANCOCIN) 1,500 mg in sodium chloride 0.9 % 500 mL IVPB  1,500 mg Intravenous Q24H Jill Side  M Masters, RPH        Allergies as of 04/29/2015 - Review Complete 04/09/2015  Allergen Reaction Noted  . Other Other (See Comments) 10/28/2011  . Rosuvastatin Other (See Comments)   . Tadalafil Other (See Comments)   . Tramadol hcl Other (See Comments)     Family History  Problem Relation Age of Onset  . Diabetes Mother   . Hypertension Mother   . Heart failure Mother   . Hyperlipidemia Mother   . Alcohol abuse Father   . Heart failure Father   . Heart attack Father 52  . Hyperlipidemia Father   . Hypertension Father   . Diabetes Sister   . Hyperlipidemia Sister   . Hypertension Sister   . Stroke Neg Hx     History   Social History  . Marital Status: Married    Spouse Name: N/A  . Number of Children: N/A  . Years of Education: N/A   Occupational History  . Retired, but teaches 6 hrs/week    Social History Main Topics  . Smoking status: Current Some Day Smoker -- 0.00 packs/day    Types: Cigars  . Smokeless tobacco: Never Used     Comment: 1 cigar / month  . Alcohol Use: 12.6 oz/week    14 Glasses of wine, 7 Shots of liquor per week  . Drug Use: No  . Sexual Activity: Not on file   Other Topics Concern  . Not on file   Social History Narrative   Married    Review of Systems: Unable to provide  Physical Exam: Vital signs in last 24 hours: Temp:  [94.3 F (34.6 C)-97.7 F (36.5 C)] 97.7 F (36.5 C) (07/22 0600) Pulse Rate:  [63-119] 63 (07/22 0600) Resp:  [13-29] 20 (07/22 0600) BP: (78-194)/(36-106) 155/106 mmHg (07/22 0600) SpO2:  [96 %-100 %] 99 % (07/22 0600) FiO2 (%):  [40 %-100 %] 40 % (07/22 0600) Weight:  [136.079 kg (300 lb)-140.615 kg (310 lb)] 140.6 kg (309 lb 15.5 oz) (07/21 2340)   General:    Intubated and sedated Head:  Normocephalic and atraumatic. Eyes:  Sclera clear, no icterus.   Conjunctiva pink. Ears:   Normal auditory acuity. Nose:  No deformity, discharge,  or lesions. Mouth:  No deformity or lesions, dentition normal. Neck:  Supple; no masses or thyromegaly. JVP not elevated Lungs:  Intubated and diffuse scattered rhonchi Heart:  Regular rate and rhythm; no murmurs, clicks, rubs,  or gallops. Abdomen:  Soft, nontender and nondistended. No masses, hepatosplenomegaly or hernias noted. Normal bowel sounds, without guarding, and without rebound.   Msk:  Symmetrical without gross deformities. Normal posture. Pulses:  No carotid, renal, femoral bruits. DP and PT symmetrical and equal Extremities:  Without clubbing or edema. Neurologic:  sedated Skin:   Decubitus back Cervical Nodes:  No significant cervical adenopathy. Psych:  Alert and cooperative. Normal mood and affect.  Intake/Output from previous day: 07/21 0701 - 07/22 0700 In: 1579.8 [I.V.:1329.8; IV Piggyback:250] Out: 2365 [Urine:2365] Intake/Output this shift: Total I/O In: 1579.8 [I.V.:1329.8; IV Piggyback:250] Out: 2365 [Urine:2365]  Lab Results:  Recent Labs  04/11/2015 1905 04/29/2015 1907 May 16, 2015 0153  WBC 12.6*  --  19.0*  HGB 17.1* 18.4* 15.1  HCT 50.6 54.0* 46.8  PLT 200  --  233   BMET  Recent Labs  04/29/2015 1905 04/15/2015 1907 May 16, 2015 0153  NA 141 144 146*  K 4.9 4.8 6.0*  CL 106 107 106  CO2 11*  --  6*  GLUCOSE 221* 218* 166*  BUN 18 24* 20  CREATININE 1.43* 1.40* 1.89*  CALCIUM 9.3  --  8.7*  PHOS  --   --  6.3*   LFT  Recent Labs  05/02/2015 1905 05/25/2015 0550  PROT 7.2  --   ALBUMIN 4.2  --   AST 43*  --   ALT 47  --   ALKPHOS 49 48  BILITOT 0.7  --    PT/INR  Recent Labs  04/24/2015 1905  LABPROT 14.2  INR 1.08   Hepatitis Panel No results for input(s): HEPBSAG, HCVAB, HEPAIGM, HEPBIGM in the last 72 hours.  Studies/Results: Ct Head Wo Contrast  04/24/2015   CLINICAL DATA:  Dementia.  Altered mental status.  EXAM: CT HEAD WITHOUT CONTRAST  CT CERVICAL SPINE WITHOUT  CONTRAST  TECHNIQUE: Multidetector CT imaging of the head and cervical spine was performed following the standard protocol without intravenous contrast. Multiplanar CT image reconstructions of the cervical spine were also generated.  COMPARISON:  Head CT - 04/17/2011; 04/10/2009  FINDINGS: CT HEAD FINDINGS  Similar findings of centralized atrophy with commensurate ex vacuo dilatation of the ventricular system. Bilateral basal ganglial calcifications. The gray-white differentiation is otherwise well maintained without CT evidence acute large territory infarct. No intraparenchymal or extra-axial mass or hemorrhage. Unchanged size a configuration of the ventricles and basilar cisterns. No midline shift.  The patient is intubated. There is minimal mucosal thickening of the bilateral maxillary sinuses. Remaining paranasal sinuses and mastoid air cells are normally aerated. Debris is seen within the posterior nasal pharynx, likely secondary to intubated state.  Regional soft tissues appear normal. No displaced calvarial fracture.  CT CERVICAL SPINE FINDINGS  C1 to the superior endplate of T2 is imaged.  Normal alignment of the cervical spine. No anterolisthesis or retrolisthesis. The bilateral facets are normally aligned. The dens is normally positioned between the lateral masses of C1. Normal atlantodental and atlantoaxial articulations.  No fracture or static subluxation of the cervical spine. Cervical vertebral body heights are preserved. Prevertebral soft tissues are normal.  Mild to moderate multilevel cervical spine DDD, worse at C5-C6 and C6-C7 with disc space height loss, endplate irregularity and sclerosis.  There is partial ossification of the nuchal ligament posterior to the C5, C6 and C7 spinous processes.  Note is made of a peripherally calcified approximately 0.8 cm) nodule within the caudal aspect of the right lobe of the thyroid (84, series 302). Regional soft tissues appear otherwise normal.  Endotracheal  and enteric tube tips terminate below image field-of-view.  Limited visualization of lung apices demonstrates minimal biapical pleural parenchymal thickening.  IMPRESSION: 1. Similar findings of centralized atrophy without acute intracranial process. 2. No fracture static subluxation of the cervical spine. 3. Mild-to-moderate multilevel cervical spine DDD.   Electronically Signed   By: Simonne Come M.D.   On: 05/04/2015 20:07   Ct Cervical Spine Wo Contrast  05/04/2015   CLINICAL DATA:  Dementia.  Altered mental status.  EXAM: CT HEAD WITHOUT CONTRAST  CT CERVICAL SPINE WITHOUT CONTRAST  TECHNIQUE: Multidetector CT imaging of the head and cervical spine was performed following the standard protocol without intravenous contrast. Multiplanar CT image reconstructions of the cervical spine were also generated.  COMPARISON:  Head CT - 04/17/2011; 04/10/2009  FINDINGS: CT HEAD FINDINGS  Similar findings of centralized atrophy with commensurate ex vacuo dilatation of the ventricular system. Bilateral basal ganglial calcifications. The gray-white differentiation is otherwise well maintained without CT evidence acute large territory infarct.  No intraparenchymal or extra-axial mass or hemorrhage. Unchanged size a configuration of the ventricles and basilar cisterns. No midline shift.  The patient is intubated. There is minimal mucosal thickening of the bilateral maxillary sinuses. Remaining paranasal sinuses and mastoid air cells are normally aerated. Debris is seen within the posterior nasal pharynx, likely secondary to intubated state.  Regional soft tissues appear normal. No displaced calvarial fracture.  CT CERVICAL SPINE FINDINGS  C1 to the superior endplate of T2 is imaged.  Normal alignment of the cervical spine. No anterolisthesis or retrolisthesis. The bilateral facets are normally aligned. The dens is normally positioned between the lateral masses of C1. Normal atlantodental and atlantoaxial articulations.  No  fracture or static subluxation of the cervical spine. Cervical vertebral body heights are preserved. Prevertebral soft tissues are normal.  Mild to moderate multilevel cervical spine DDD, worse at C5-C6 and C6-C7 with disc space height loss, endplate irregularity and sclerosis.  There is partial ossification of the nuchal ligament posterior to the C5, C6 and C7 spinous processes.  Note is made of a peripherally calcified approximately 0.8 cm) nodule within the caudal aspect of the right lobe of the thyroid (84, series 302). Regional soft tissues appear otherwise normal.  Endotracheal and enteric tube tips terminate below image field-of-view.  Limited visualization of lung apices demonstrates minimal biapical pleural parenchymal thickening.  IMPRESSION: 1. Similar findings of centralized atrophy without acute intracranial process. 2. No fracture static subluxation of the cervical spine. 3. Mild-to-moderate multilevel cervical spine DDD.   Electronically Signed   By: Simonne Come M.D.   On: 04/23/2015 20:07   Dg Chest Port 1 View  May 08, 2015   CLINICAL DATA:  Endotracheal tube and dialysis catheter placement. Initial encounter.  EXAM: PORTABLE CHEST - 1 VIEW  COMPARISON:  Chest radiograph performed 04/20/2015  FINDINGS: The patient's endotracheal tube is seen ending 5 cm above the carina. A left IJ line is noted ending about the proximal to mid SVC. An enteric tube is noted extending below the diaphragm.  Mild vascular congestion is noted. Minimal left basilar atelectasis is seen. No pleural effusion or pneumothorax is identified.  The cardiomediastinal silhouette is borderline normal in size. No acute osseous abnormalities are identified.  IMPRESSION: 1. Endotracheal tube seen ending 5 cm above the carina. 2. Left IJ line noted ending about the proximal to mid SVC. 3. Mild vascular congestion noted. Minimal left basilar atelectasis seen.   Electronically Signed   By: Roanna Raider M.D.   On: 05/08/15 06:22    Dg Chest Port 1 View  04/11/2015   CLINICAL DATA:  Intubated.  EXAM: PORTABLE CHEST - 1 VIEW  COMPARISON:  10/03/2012.  FINDINGS: Endotracheal tube in satisfactory position. Nasogastric tube extending into the stomach. Poor inspiration with no gross cardiac enlargement. Mild patchy opacity at the medial left lung base. Unremarkable bones.  IMPRESSION: 1. Endotracheal tube in satisfactory position. 2. Mild patchy atelectasis, pneumonia or aspiration pneumonitis at the medial left lung base.   Electronically Signed   By: Beckie Salts M.D.   On: 04/30/2015 20:05    Assessment/Plan:  Acute non oliguric renal failure  Creatinine has increased 1.9 baseline 1.2  Appears to be in the setting of hypotension. I suspect that this represents ischemic injury and use of ACE inhibitor enalopril. There is nothing to suggest obstruction although it may be reasonable to check a renal ultrasound if aggressive measures are to be undertaken. There is nothing in the history to suggest acute GN or  acute interstitial disease.  Metabolic acidosis   High lactate  Would suggest some ischemia and could also be due to metformin in setting of acute renal failure  Hyperkalemia treated with kayexalate enema  The patient is DNR and the family are agreed that they would not want dialysis support even if it was short term. The case was discussed with Dr Vassie Loll.   LOS: 1 Durelle Zepeda W @TODAY @6 :40 AM

## 2015-05-06 NOTE — Progress Notes (Signed)
STAT EEG completed; results pending. 

## 2015-05-06 NOTE — Progress Notes (Signed)
ANTIBIOTIC CONSULT NOTE - FOLLOW UP  Pharmacy Consult for Azithromycin (already on triple coverage for meningitis) Indication: Atypical coverage for possible CAP  Allergies  Allergen Reactions  . Other Other (See Comments)    Anesthetics during lumbar laminectomy surgery - blood pressure increased quickly   . Rosuvastatin Other (See Comments)     myalgias  . Tadalafil Other (See Comments)     headache, elevated blood pressure  . Tramadol Hcl Other (See Comments)     Didnt help Headaches and patient gets dopey on med    Patient Measurements: Height:  (188 cm) Weight: (!) 309 lb 15.5 oz (140.6 kg) (per ed; bed scale invalid) IBW/kg (Calculated) : 82.2  Vital Signs: Temp: 96.8 F (36 C) (07/22 0400) Temp Source: Core (Comment) (07/22 0000) BP: 190/75 mmHg (07/22 0400) Pulse Rate: 84 (07/22 0400) Intake/Output from previous day: 07/21 0701 - 07/22 0700 In: 879.8 [I.V.:879.8] Out: 2365 [Urine:2365] Intake/Output from this shift: Total I/O In: 879.8 [I.V.:879.8] Out: 2365 [Urine:2365]  Labs:  Recent Labs  04/11/2015 1905 04/23/2015 1907 2015-05-10 0153  WBC 12.6*  --  19.0*  HGB 17.1* 18.4* 15.1  PLT 200  --  233  CREATININE 1.43* 1.40* 1.89*   Estimated Creatinine Clearance: 51.2 mL/min (by C-G formula based on Cr of 1.89). No results for input(s): VANCOTROUGH, VANCOPEAK, VANCORANDOM, GENTTROUGH, GENTPEAK, GENTRANDOM, TOBRATROUGH, TOBRAPEAK, TOBRARND, AMIKACINPEAK, AMIKACINTROU, AMIKACIN in the last 72 hours.   Microbiology: Recent Results (from the past 720 hour(s))  MRSA PCR Screening     Status: None   Collection Time: 04/23/2015 11:45 PM  Result Value Ref Range Status   MRSA by PCR NEGATIVE NEGATIVE Final    Comment:        The GeneXpert MRSA Assay (FDA approved for NASAL specimens only), is one component of a comprehensive MRSA colonization surveillance program. It is not intended to diagnose MRSA infection nor to guide or monitor treatment for MRSA  infections.     Anti-infectives    Start     Dose/Rate Route Frequency Ordered Stop   2015-05-10 2200  ampicillin (OMNIPEN) 2 g in sodium chloride 0.9 % 50 mL IVPB     2 g 150 mL/hr over 20 Minutes Intravenous 4 times per day 04/30/2015 2150     05/10/2015 2200  vancomycin (VANCOCIN) 1,500 mg in sodium chloride 0.9 % 500 mL IVPB     1,500 mg 250 mL/hr over 120 Minutes Intravenous Every 24 hours 04/28/2015 2157     05/10/15 0515  azithromycin (ZITHROMAX) 500 mg in dextrose 5 % 250 mL IVPB     500 mg 250 mL/hr over 60 Minutes Intravenous Every 24 hours 2015/05/10 0511     04/20/2015 2200  cefTRIAXone (ROCEPHIN) 2 g in dextrose 5 % 50 mL IVPB     2 g 100 mL/hr over 30 Minutes Intravenous Every 12 hours 04/27/2015 2150     04/22/2015 2200  vancomycin (VANCOCIN) 2,500 mg in sodium chloride 0.9 % 500 mL IVPB     2,500 mg 250 mL/hr over 120 Minutes Intravenous  Once 04/08/2015 2150 05-10-15 0111      Assessment: Adding azithromycin to Vanco/Ceftriaxone/Ampicillin for atypical coverage for possible CAP  Plan:  -Azithromycin 500 mg IV q24h  Abran Duke 05-10-15,5:11 AM

## 2015-05-06 NOTE — Discharge Summary (Signed)
Family Medicine Teaching [facility] Discharge Summary  Patient name: [name] Medical record number: [id number] Date of birth: 1941-03-20 Age: 74 y.o. Gender: male Date of Admission: 04/05/2015  Date of Discharge: Apr 28, 2015 Admitting Physician: Oretha Milch, MD  Primary Care Provider: Marga Melnick, MD Consultants: nephrology, neurology  Indication for Hospitalization: AMS  Discharge Diagnoses/Problem List:  AMS Acute hypoxemic resp failure Lactic acidosis AKI Leukocytosis  Disposition: Morgue   Discharge Condition: Deceased  Discharge Exam: (prior to decompensation) General: Morbidly obese male sedated Neuro: Sedated on vent HEENT: [place]/AT, ETT in place, PERRL but slow. Cardiovascular: RRR, no MRG Lungs: Clear bilateral breath sounds Abdomen: Obese, soft, non-distended  Musculoskeletal: No acute deformity Skin: Grossly intact, sluggish cap refill  Brief Hospital Course:  Patient found by wife, minimally responsive and on floor w/ CPAP in place. Brought to the ED and had an initial GCS of 3. Patient intubated due to concern for airway. Patient was found to be severely acidotic (lactic acid 7.9). He was provided 3L total boluses in the ED. 3 amps of bicarb were also provided and a bicarb drip was initiated. Abx coverage was initiated and cultures were obtained. Discussion w/ the family indicated that they desired a DNR status for this patient.   In the following morning patient had persisted in this acidotic state. Patient's blood pressure began to drop. An additional liter of NS was provided. Repeat ABG showed pH of 6.930. Patient continued to decompensate and became bradycardic. Family was called and were able to arrive just prior to patient's passing.  Significant Labs and Imaging:   Recent Labs Lab 04/21/2015 1905 04/27/2015 1907 April 28, 2015 0153  WBC 12.6*  --  19.0*  HGB 17.1* 18.4* 15.1  HCT 50.6 54.0* 46.8  PLT 200  --  233    Recent Labs Lab  04/18/2015 1905 04/15/2015 1907 2015/04/28 0153 April 28, 2015 0550  NA 141 144 146*  --   K 4.9 4.8 6.0*  --   CL 106 107 106  --   CO2 11*  --  6*  --   GLUCOSE 221* 218* 166*  --   BUN 18 24* 20  --   CREATININE 1.43* 1.40* 1.89*  --   CALCIUM 9.3  --  8.7*  --   MG  --   --  2.4  --   PHOS  --   --  6.3*  --   ALKPHOS 49  --   --  48  AST 43*  --   --   --   ALT 47  --   --   --   ALBUMIN 4.2  --   --   --    Discharge Medications:    Medication List    ASK your doctor about these medications        enalapril 20 MG tablet  Commonly known as:  VASOTEC  Take 20 mg by mouth daily.     enalapril 20 MG tablet  Commonly known as:  VASOTEC  Take 1 tablet (20 mg total) by mouth daily.     fish oil-omega-3 fatty acids 1000 MG capsule  Take 1 g by mouth daily.     Flax Seed Oil 1000 MG Caps  Take 1,000 mg by mouth daily.     Fluticasone-Salmeterol 250-50 MCG/DOSE Aepb  Commonly known as:  ADVAIR DISKUS  Inhale 1 puff into the lungs 2 (two) times daily.     furosemide 40 MG tablet  Commonly known as:  LASIX  Take 1 tablet (40 mg total) by mouth daily.     gabapentin 300 MG capsule  Commonly known as:  NEURONTIN  Take 600 mg by mouth 3 (three) times daily.     meloxicam 7.5 MG tablet  Commonly known as:  MOBIC  Take 7.5 mg by mouth 2 (two) times daily.     metFORMIN 500 MG tablet  Commonly known as:  GLUCOPHAGE  Take 500 mg by mouth 2 (two) times daily with a meal.     modafinil 200 MG tablet  Commonly known as:  PROVIGIL  Take 200 mg by mouth every morning.     multivitamin with minerals Tabs tablet  Take 1 tablet by mouth daily.     NAMENDA 10 MG tablet  Generic drug:  memantine  Take 10 mg by mouth 2 (two) times daily.     simvastatin 40 MG tablet  Commonly known as:  ZOCOR  Take 1 tablet (40 mg total) by mouth at bedtime.         Kathee Delton, MD 04/10/2015, 3:16 PM PGY-2, [facility]

## 2015-05-06 NOTE — Consult Note (Addendum)
Consult Reason for Consult: altered mental status Referring Physician: Dr Vassie Loll CCM  CC: altered mental status  HPI: [name] is an 74 y.o. male hx of dementia found down at home on 7/21. LSW 0730 on 7/21 when his wife left for work. She returned home from work at [employer] and noted his car was parked crooked in the driveway. Wife found him sitting on the floor next to his bed with feces on the bed and on the ground. Upon arrival to ED noted to be lethargic, would turn head when his name was called but non-verbal. Appeared to move all extremities symmetrically. Was intubated for airway protection.   CT head imaging reviewed, no acute process. Afebrile in the ED. WBC 12.6. Lactic acid 7.92. Initial pH 7.082, repeat dropped to 6.986. Started on ampicillin, ceftriaxone and vancomycin in the ED.   Past Medical History  Diagnosis Date  . Hyperlipidemia     [place] Study LDL goal = < 130 if non smoker (he smokes 1 cigar/mouth)  . Hypertension   . Diverticulosis of colon   . Sleep apnea     CPAP Rxed by Dr Marylou Flesher  . Spinal stenosis   . Benign prostatic hypertrophy   . VT (ventricular tachycardia) 04/2011    post op  . Sleep apnea   . DVT (deep venous thrombosis)   . Frontotemporal dementia     Dr Olena Heckle    Past Surgical History  Procedure Laterality Date  . Hemorrhoid surgery    . Anal fistulectomy    . Lumbar laminectomy  1975  . Colonoscopy      tics, Dr Matthias Hughs  . Tonsillectomy    . Vasectomy    . Spinal tap  07/2010    09/200 elevated csf protein; + ANA (normal cryoglobulin, ANCA, Rehmatology evaltuaion  . Neuropsych eval      ? progressive bilat subcortical white matter disease, R/O autoimmune dementia  . Lumbar laminectomy  04/2011    Complicated by postop tachycardia, profound fever and mental status changes    Family History  Problem Relation Age of Onset  . Diabetes Mother   . Hypertension Mother   . Heart failure Mother   . Hyperlipidemia Mother   .  Alcohol abuse Father   . Heart failure Father   . Heart attack Father 45  . Hyperlipidemia Father   . Hypertension Father   . Diabetes Sister   . Hyperlipidemia Sister   . Hypertension Sister   . Stroke Neg Hx     Social History:  reports that he has been smoking Cigars.  He has never used smokeless tobacco. He reports that he drinks about 12.6 oz of alcohol per week. He reports that he does not use illicit drugs.  Allergies  Allergen Reactions  . Other Other (See Comments)    Anesthetics during lumbar laminectomy surgery - blood pressure increased quickly   . Rosuvastatin Other (See Comments)     myalgias  . Tadalafil Other (See Comments)     headache, elevated blood pressure  . Tramadol Hcl Other (See Comments)     Didnt help Headaches and patient gets dopey on med    Medications:  Scheduled: . ampicillin (OMNIPEN) IV  2 g Intravenous 4 times per day  . antiseptic oral rinse  7 mL Mouth Rinse QID  . cefTRIAXone (ROCEPHIN)  IV  2 g Intravenous Q12H  . chlorhexidine  15 mL Mouth Rinse BID  . famotidine (PEPCID) IV  20 mg Intravenous  Q12H  . heparin  5,000 Units Subcutaneous 3 times per day  . insulin aspart  2-6 Units Subcutaneous 6 times per day  . lidocaine (cardiac) 100 mg/62ml      . rocuronium      . vancomycin  1,500 mg Intravenous Q24H    ROS: Out of a complete 14 system review, the patient complains of only the following symptoms, and all other reviewed systems are negative. Unable to obtain  Physical Examination: Filed Vitals:   Apr 28, 2015 0100  BP: 118/50  Pulse: 67  Temp: 94.3 F (34.6 C)  Resp: 26   Physical Exam  Constitutional: He appears well-developed and well-nourished.  Psych: Affect appropriate to situation Eyes: No scleral injection HENT: No OP obstrucion Head: Normocephalic.  Cardiovascular: Normal rate and regular rhythm.  Respiratory: Effort normal and breath sounds normal.  GI: Soft. Bowel sounds are normal. No distension. There is  no tenderness.  Skin: WDI  Neurologic Examination Mental Status: currently on propofol for sedation Intubated, nonverbal, not following commands. Breathing over the vent Cranial Nerves: II: optic discs not visualized, no blink to threat. Pupils 2mm and reactive.  III,IV, VI: ptosis not present, eyes midline, no gaze deviation V,VII: intubated, face appears symmetric, unable to test facial sensation VIII: unable to test IX,X: gag reflex present XI: unable to test XII: unable to test Motor: No spontaneous movement Tone and bulk:normal tone throughout; no atrophy noted Sensory: no withdrawal to noxious stimuli Deep Tendon Reflexes: 2+ and symmetric throughout Plantars: Right: downgoing   Left: downgoing Cerebellar: Unable to test Gait: unable to test  Laboratory Studies:   Basic Metabolic Panel:  Recent Labs Lab 04/22/2015 1905 04/16/2015 1907  NA 141 144  K 4.9 4.8  CL 106 107  CO2 11*  --   GLUCOSE 221* 218*  BUN 18 24*  CREATININE 1.43* 1.40*  CALCIUM 9.3  --     Liver Function Tests:  Recent Labs Lab 04/23/2015 1905  AST 43*  ALT 47  ALKPHOS 49  BILITOT 0.7  PROT 7.2  ALBUMIN 4.2   No results for input(s): LIPASE, AMYLASE in the last 168 hours. No results for input(s): AMMONIA in the last 168 hours.  CBC:  Recent Labs Lab 04/28/2015 1905 04/16/2015 1907  WBC 12.6*  --   NEUTROABS 8.1*  --   HGB 17.1* 18.4*  HCT 50.6 54.0*  MCV 96.4  --   PLT 200  --     Cardiac Enzymes: No results for input(s): CKTOTAL, CKMB, CKMBINDEX, TROPONINI in the last 168 hours.  BNP: Invalid input(s): POCBNP  CBG:  Recent Labs Lab 04-28-2015 0024  GLUCAP 152*    Microbiology: Results for orders placed or performed during the hospital encounter of 04/16/11  Urine culture     Status: None   Collection Time: 04/17/11 11:57 AM  Result Value Ref Range Status   Specimen Description URINE, CATHETERIZED  Final   Special Requests NONE  Final   Culture  Setup Time  [id number]  Final   Colony Count NO GROWTH  Final   Culture NO GROWTH  Final   Report Status 04/18/2011 FINAL  Final  Culture, blood (routine x 2)     Status: None   Collection Time: 04/17/11 12:20 PM  Result Value Ref Range Status   Specimen Description BLOOD LEFT HAND  Final   Special Requests   Final    BOTTLES DRAWN AEROBIC AND ANAEROBIC 10CC RED 5CC BLUE   Culture  Setup Time [id number]  Final   Culture NO GROWTH 5 DAYS  Final   Report Status 04/23/2011 FINAL  Final  Culture, blood (routine x 2)     Status: None   Collection Time: 04/17/11 12:35 PM  Result Value Ref Range Status   Specimen Description BLOOD LEFT ARM  Final   Special Requests BOTTLES DRAWN AEROBIC AND ANAEROBIC 10CC  Final   Culture  Setup Time [id number]  Final   Culture NO GROWTH 5 DAYS  Final   Report Status 04/23/2011 FINAL  Final    Coagulation Studies:  Recent Labs  04/16/2015 1905  LABPROT 14.2  INR 1.08    Urinalysis:  Recent Labs Lab 04/20/2015 1918  COLORURINE YELLOW  LABSPEC 1.013  PHURINE 6.0  GLUCOSEU NEGATIVE  HGBUR NEGATIVE  BILIRUBINUR NEGATIVE  KETONESUR NEGATIVE  PROTEINUR NEGATIVE  UROBILINOGEN 0.2  NITRITE NEGATIVE  LEUKOCYTESUR NEGATIVE    Lipid Panel:     Component Value Date/Time   CHOL 143 12/02/2012 0933   TRIG 162.0* 12/02/2012 0933   HDL 28.90* 12/02/2012 0933   CHOLHDL 5 12/02/2012 0933   VLDL 32.4 12/02/2012 0933   LDLCALC 82 12/02/2012 0933    HgbA1C:  Lab Results  Component Value Date   HGBA1C 6.5 12/06/2012    Urine Drug Screen:     Component Value Date/Time   LABOPIA NONE DETECTED 04/13/2015 1918   COCAINSCRNUR NONE DETECTED 05/03/2015 1918   LABBENZ NONE DETECTED 04/16/2015 1918   AMPHETMU NONE DETECTED 04/30/2015 1918   THCU NONE DETECTED 04/17/2015 1918   LABBARB NONE DETECTED 04/18/2015 1918    Alcohol Level:  Recent Labs Lab 04/14/2015 2102  ETH <5     Imaging: Ct Head Wo Contrast  04/18/2015   CLINICAL DATA:  Dementia.   Altered mental status.  EXAM: CT HEAD WITHOUT CONTRAST  CT CERVICAL SPINE WITHOUT CONTRAST  TECHNIQUE: Multidetector CT imaging of the head and cervical spine was performed following the standard protocol without intravenous contrast. Multiplanar CT image reconstructions of the cervical spine were also generated.  COMPARISON:  Head CT - 04/17/2011; 04/10/2009  FINDINGS: CT HEAD FINDINGS  Similar findings of centralized atrophy with commensurate ex vacuo dilatation of the ventricular system. Bilateral basal ganglial calcifications. The gray-white differentiation is otherwise well maintained without CT evidence acute large territory infarct. No intraparenchymal or extra-axial mass or hemorrhage. Unchanged size a configuration of the ventricles and basilar cisterns. No midline shift.  The patient is intubated. There is minimal mucosal thickening of the bilateral maxillary sinuses. Remaining paranasal sinuses and mastoid air cells are normally aerated. Debris is seen within the posterior nasal pharynx, likely secondary to intubated state.  Regional soft tissues appear normal. No displaced calvarial fracture.  CT CERVICAL SPINE FINDINGS  C1 to the superior endplate of T2 is imaged.  Normal alignment of the cervical spine. No anterolisthesis or retrolisthesis. The bilateral facets are normally aligned. The dens is normally positioned between the lateral masses of C1. Normal atlantodental and atlantoaxial articulations.  No fracture or static subluxation of the cervical spine. Cervical vertebral body heights are preserved. Prevertebral soft tissues are normal.  Mild to moderate multilevel cervical spine DDD, worse at C5-C6 and C6-C7 with disc space height loss, endplate irregularity and sclerosis.  There is partial ossification of the nuchal ligament posterior to the C5, C6 and C7 spinous processes.  Note is made of a peripherally calcified approximately 0.8 cm) nodule within the caudal aspect of the right lobe of the  thyroid (84, series 302). Regional soft  tissues appear otherwise normal.  Endotracheal and enteric tube tips terminate below image field-of-view.  Limited visualization of lung apices demonstrates minimal biapical pleural parenchymal thickening.  IMPRESSION: 1. Similar findings of centralized atrophy without acute intracranial process. 2. No fracture static subluxation of the cervical spine. 3. Mild-to-moderate multilevel cervical spine DDD.   Electronically Signed   By: Simonne Come M.D.   On: 04/16/2015 20:07   Ct Cervical Spine Wo Contrast  05/02/2015   CLINICAL DATA:  Dementia.  Altered mental status.  EXAM: CT HEAD WITHOUT CONTRAST  CT CERVICAL SPINE WITHOUT CONTRAST  TECHNIQUE: Multidetector CT imaging of the head and cervical spine was performed following the standard protocol without intravenous contrast. Multiplanar CT image reconstructions of the cervical spine were also generated.  COMPARISON:  Head CT - 04/17/2011; 04/10/2009  FINDINGS: CT HEAD FINDINGS  Similar findings of centralized atrophy with commensurate ex vacuo dilatation of the ventricular system. Bilateral basal ganglial calcifications. The gray-white differentiation is otherwise well maintained without CT evidence acute large territory infarct. No intraparenchymal or extra-axial mass or hemorrhage. Unchanged size a configuration of the ventricles and basilar cisterns. No midline shift.  The patient is intubated. There is minimal mucosal thickening of the bilateral maxillary sinuses. Remaining paranasal sinuses and mastoid air cells are normally aerated. Debris is seen within the posterior nasal pharynx, likely secondary to intubated state.  Regional soft tissues appear normal. No displaced calvarial fracture.  CT CERVICAL SPINE FINDINGS  C1 to the superior endplate of T2 is imaged.  Normal alignment of the cervical spine. No anterolisthesis or retrolisthesis. The bilateral facets are normally aligned. The dens is normally positioned between  the lateral masses of C1. Normal atlantodental and atlantoaxial articulations.  No fracture or static subluxation of the cervical spine. Cervical vertebral body heights are preserved. Prevertebral soft tissues are normal.  Mild to moderate multilevel cervical spine DDD, worse at C5-C6 and C6-C7 with disc space height loss, endplate irregularity and sclerosis.  There is partial ossification of the nuchal ligament posterior to the C5, C6 and C7 spinous processes.  Note is made of a peripherally calcified approximately 0.8 cm) nodule within the caudal aspect of the right lobe of the thyroid (84, series 302). Regional soft tissues appear otherwise normal.  Endotracheal and enteric tube tips terminate below image field-of-view.  Limited visualization of lung apices demonstrates minimal biapical pleural parenchymal thickening.  IMPRESSION: 1. Similar findings of centralized atrophy without acute intracranial process. 2. No fracture static subluxation of the cervical spine. 3. Mild-to-moderate multilevel cervical spine DDD.   Electronically Signed   By: Simonne Come M.D.   On: 04/20/2015 20:07   Dg Chest Port 1 View  04/17/2015   CLINICAL DATA:  Intubated.  EXAM: PORTABLE CHEST - 1 VIEW  COMPARISON:  10/03/2012.  FINDINGS: Endotracheal tube in satisfactory position. Nasogastric tube extending into the stomach. Poor inspiration with no gross cardiac enlargement. Mild patchy opacity at the medial left lung base. Unremarkable bones.  IMPRESSION: 1. Endotracheal tube in satisfactory position. 2. Mild patchy atelectasis, pneumonia or aspiration pneumonitis at the medial left lung base.   Electronically Signed   By: Beckie Salts M.D.   On: 04/25/2015 20:05     Assessment/Plan:  73y/o hx of dementia, HTN, sleep apnea presenting with altered mental status. Found down by wife surrounded by feces at 1730. LSW 0730 on 7/21. Intubated in ED for airway protection. Remains unresponsive. His pH continues to drop raising question  of continued seizure activity. Afebrile but  with elevated white count which could be related to seizure vs ictal activity.   -stat EEG to rule out NCSE (tech notified) -MRI brain -agree with antibiotic coverage. Consider LP depending on results of above -check ammonia, TSH -will continue to follow  This patient is critically ill and at significant risk of neurological worsening, death and care requires constant monitoring of vital signs, hemodynamics,respiratory and cardiac monitoring,review of multiple databases, neurological assessment, discussion with family, other specialists and medical decision making of high complexity. I spent 45 inutes of neurocritical care time in the care of this patient.    Elspeth Cho, DO [facility] [phone]  If 7pm- 7am, please page neurology on call as listed in AMION. 04/07/2015, 1:28 AM

## 2015-05-06 NOTE — Procedures (Signed)
Hemodialysis Insertion Procedure Note CREEDON DANIELSKI [id number] [birth year]  Procedure: Insertion of Hemodialysis Catheter Type: 3 port  Indications: Hemodialysis   Procedure Details Consent: Unable to obtain consent because of emergent medical necessity. Time Out: Verified patient identification, verified procedure, site/side was marked, verified correct patient position, special equipment/implants available, medications/allergies/relevent history reviewed, required imaging and test results available.  Performed  Maximum sterile technique was used including antiseptics, cap, gloves, gown, hand hygiene, mask and sheet. Skin prep: Chlorhexidine; local anesthetic administered A antimicrobial bonded/coated triple lumen catheter was placed in the left internal jugular vein using the Seldinger technique. Ultrasound guidance used.Yes.   Catheter placed to 20 cm. Blood aspirated via all 3 ports and then flushed x 3. Line sutured x 2 and dressing applied.  Evaluation Blood flow good Complications: No apparent complications Patient did tolerate procedure well. Chest X-ray ordered to verify placement.  CXR: pending.  Joneen Roach, AGACNP-BC [facility] [place] Pulmonology/Critical Care Pager [phone] or [phone]  05/03/2015 5:53 AM

## 2015-05-06 NOTE — Progress Notes (Signed)
CRITICAL VALUE ALERT  Critical value received: la> 30; ph 6.94; co2 15.1; bicarb 3.1  Date of notification:  7/22  Time of notification:  0355  Critical value read back:Yes.    Nurse who received alert:  km  Responding MD: haney ccm resident  Time MD responded:  0355  Redraw lactic acid stat. Will continue to monitor.

## 2015-05-06 NOTE — Procedures (Signed)
ELECTROENCEPHALOGRAM REPORT   Patient: [name]      Room #: 43M-08 Age: 74 y.o.        Sex: male Referring Physician: Dr Vassie Loll CCM Report Date:  04/25/2015        Interpreting Physician: Omelia Blackwater  History: [name] is an 74 y.o. male admitted with altered mental status of unclear etiology  Medications:  Scheduled: . ampicillin (OMNIPEN) IV  2 g Intravenous 4 times per day  . antiseptic oral rinse  7 mL Mouth Rinse QID  . cefTRIAXone (ROCEPHIN)  IV  2 g Intravenous Q12H  . chlorhexidine  15 mL Mouth Rinse BID  . famotidine (PEPCID) IV  20 mg Intravenous Q12H  . heparin  5,000 Units Subcutaneous 3 times per day  . insulin aspart  2-6 Units Subcutaneous 6 times per day  . lidocaine (cardiac) 100 mg/34ml      . rocuronium      . vancomycin  1,500 mg Intravenous Q24H    Conditions of Recording:  This is a 18 channel EEG carried out with the patient in the intubated and sedated state, currently on 10 of propofol.  Description:  The background activity consists of a markedly low voltage, symmetrical, fairly well organized mix of theta and delta activity. No posterior dominant alpha rhythm is noted. No focal slowing or epileptiform activity is noted. Normal sleep architecture is not observed.   Hyperventilation was not performed.  Intermittent photic stimulation was not performed.   IMPRESSION: This is an abnormal EEG secondary to low voltage and  general background slowing.  This finding may be seen with a diffuse disturbance that is etiologically nonspecific, but may include a metabolic encephalopathy or medication effect, among other possibilities.  No epileptiform activity was noted.     Elspeth Cho, DO [facility] [phone]  If 7pm- 7am, please page neurology on call as listed in AMION. 05/03/2015, 4:05 AM

## 2015-05-06 NOTE — Progress Notes (Addendum)
0430    Pt heart rhythm AFib with RVR in 140s to 150s. EKG done. Joneen Roach NP at bedside. 2.5 ml of metoprolol given. Pt heart rate slowed, now 110s to 120s still in afib. Will continue to monitor.

## 2015-05-06 NOTE — Progress Notes (Signed)
 of fentanyl wasted with Hermelinda Medicus in sink

## 2015-05-06 NOTE — Progress Notes (Signed)
Chaplain responded to consult from MD that pt had just passed.  Pt's spouse at bedside grieving appropriately.  Daughter soon entered room, also grieving.  Son and pt's spouse now at bedside, daughter in waiting area for more family to arrive.  Chaplain providing emotional support and hospitality.  Chaplain will continue to work with family as needed.    04/16/2015 0900  Clinical Encounter Type  Visited With Family;Patient not available;Health care provider  Visit Type Initial;Spiritual support;Social support;Critical Care;Death  Referral From Physician  Spiritual Encounters  Spiritual Needs Emotional;Grief support  Stress Factors  Family Stress Factors Family relationships;Loss   Blain Pais 04/07/2015 10:00 AM

## 2015-05-06 NOTE — Progress Notes (Signed)
CRITICAL VALUE ALERT  Critical value received: ph 6.924; pCO2 19.5;  PO2 196. bicarb 3.8  Date of notification: 7/22  Time of notification: 0507  Critical value read back:Yes.   Nurse who received alert: Janna Arch RN   Responding MD: Joneen Roach NP   Time MD responded: [phone]  Will continue to monitor.

## 2015-05-06 NOTE — Progress Notes (Addendum)
CRITICAL VALUE ALERT  Critical value received:  Ph 6.98; co2 17.7; bicarb 4.2  Date of notification:  04/06/2015  Time of notification:  0105  Critical value read back:Yes.    Nurse who received alert:  km  Responding MD:  Joneen Roach NP  Time MD responded:  0105   2 amps Bicarb and gtt ordered. Stat labs. Hold MRI for now. Plan Neuro Consult.  Will continue to monitor.

## 2015-05-06 DEATH — deceased
